# Patient Record
Sex: Male | Born: 1980 | Race: Black or African American | Hispanic: No | Marital: Single | Smoking: Never smoker
Health system: Southern US, Community
[De-identification: ages and names within clinical notes are randomized; demographics above are authoritative.]

## PROBLEM LIST (undated history)

## (undated) DIAGNOSIS — E7800 Pure hypercholesterolemia, unspecified: Secondary | ICD-10-CM

## (undated) DIAGNOSIS — Q150 Congenital glaucoma: Secondary | ICD-10-CM

## (undated) DIAGNOSIS — K219 Gastro-esophageal reflux disease without esophagitis: Secondary | ICD-10-CM

## (undated) DIAGNOSIS — F819 Developmental disorder of scholastic skills, unspecified: Secondary | ICD-10-CM

## (undated) DIAGNOSIS — N19 Unspecified kidney failure: Secondary | ICD-10-CM

## (undated) DIAGNOSIS — I10 Essential (primary) hypertension: Secondary | ICD-10-CM

## (undated) DIAGNOSIS — I509 Heart failure, unspecified: Secondary | ICD-10-CM

## (undated) DIAGNOSIS — H5440 Blindness, one eye, unspecified eye: Secondary | ICD-10-CM

## (undated) DIAGNOSIS — E559 Vitamin D deficiency, unspecified: Secondary | ICD-10-CM

## (undated) HISTORY — DX: Gastro-esophageal reflux disease without esophagitis: K21.9

## (undated) HISTORY — DX: Pure hypercholesterolemia, unspecified: E78.00

## (undated) HISTORY — DX: Developmental disorder of scholastic skills, unspecified: F81.9

## (undated) HISTORY — DX: Heart failure, unspecified: I50.9

## (undated) HISTORY — DX: Essential (primary) hypertension: I10

## (undated) HISTORY — DX: Congenital glaucoma: Q15.0

## (undated) HISTORY — DX: Vitamin D deficiency, unspecified: E55.9

## (undated) HISTORY — DX: Unspecified kidney failure: N19

## (undated) HISTORY — DX: Blindness, one eye, unspecified eye: H54.40

---

## 2003-09-17 ENCOUNTER — Encounter: Admission: RE | Admit: 2003-09-17 | Discharge: 2003-09-17 | Payer: Self-pay | Admitting: Family Medicine

## 2003-11-29 ENCOUNTER — Encounter: Admission: RE | Admit: 2003-11-29 | Discharge: 2003-11-29 | Payer: Self-pay | Admitting: Cardiothoracic Surgery

## 2007-07-13 HISTORY — PX: MULTIPLE TOOTH EXTRACTIONS: SHX2053

## 2017-05-31 ENCOUNTER — Encounter: Payer: Self-pay | Admitting: Family Medicine

## 2017-09-12 ENCOUNTER — Encounter: Payer: Self-pay | Admitting: Family Medicine

## 2018-06-06 ENCOUNTER — Encounter: Payer: Self-pay | Admitting: Family Medicine

## 2018-06-06 ENCOUNTER — Ambulatory Visit (INDEPENDENT_AMBULATORY_CARE_PROVIDER_SITE_OTHER): Payer: Self-pay | Admitting: Family Medicine

## 2018-06-06 ENCOUNTER — Encounter

## 2018-06-06 VITALS — BP 101/71 | HR 80 | Resp 17 | Ht 69.0 in | Wt 189.0 lb

## 2018-06-06 DIAGNOSIS — Z7689 Persons encountering health services in other specified circumstances: Secondary | ICD-10-CM

## 2018-06-06 DIAGNOSIS — Q249 Congenital malformation of heart, unspecified: Secondary | ICD-10-CM

## 2018-06-06 DIAGNOSIS — H409 Unspecified glaucoma: Secondary | ICD-10-CM

## 2018-06-06 DIAGNOSIS — F09 Unspecified mental disorder due to known physiological condition: Secondary | ICD-10-CM

## 2018-06-06 DIAGNOSIS — R1319 Other dysphagia: Secondary | ICD-10-CM

## 2018-06-06 DIAGNOSIS — R41841 Cognitive communication deficit: Secondary | ICD-10-CM

## 2018-06-06 DIAGNOSIS — F0789 Other personality and behavioral disorders due to known physiological condition: Secondary | ICD-10-CM

## 2018-06-06 MED ORDER — ASPIRIN EC 81 MG PO TBEC: 81.0000 mg | DELAYED_RELEASE_TABLET | Freq: Every day | ORAL | 1 refills | Status: DC

## 2018-06-06 MED ORDER — ATORVASTATIN CALCIUM 20 MG PO TABS
20.0000 mg | ORAL_TABLET | Freq: Every day | ORAL | 1 refills | Status: DC
Start: 2018-06-06 — End: 2018-09-06

## 2018-06-06 NOTE — Progress Notes (Signed)
Alan Owens, is a 37 y.o. male  ZOX:[id number]  [id number]:[id number]  DOB - 08-04-1980  CC:  Chief Complaint  Patient presents with  . Establish Care    would like to have it setup so he can have a personal care aide       HPI: Alan Owens is a 37 y.o. male is here today to establish care. He is accompanied by his mother who is his caregiver.  Unfortunately his mother is a poor historian. Patient has visible cognitive deficits and at present she has requested medical records which have not arrived prior to today's visit.  She reports that he has a in-home care attendant assist with ADLs and feedings. Patient is nonverbal.  Patient is also incontinent.  Mother is unable to identify underlying cause of patient's deficits.  He reported he was born normal however then she notes that he was not immediately discharged from the hospital due to illness, which she thinks was related to breathing and possibly his heart. She is unable to tell me what type of illness patient sustained after childbirth.  According to patient mother, patient has never been verbal.  Has in the past been followed by a cardiologist, she is uncertain of her neurologist, and by an ophthalmologist due to chronic worsening glaucoma.  He has not seen the eye doctor in some time, however she reports that he is partially blind.  She reports at some point he was followed by [facility].  She recently relocated to [place] from [place] patient's [place] was not transferred over as of yet.  He is currently prescribed blood pressure medication, takes a daily aspirin, Lipitor, and medication for management of reflux and GERD.  Mother is uncertain as to why he is on some any blood pressure medications  Current medications: Current Outpatient Medications:  .  amLODipine (NORVASC) 2.5 MG tablet, Take 2.5 mg by mouth 2 (two) times daily., Disp: , Rfl:  .  aspirin EC 81 MG tablet, Take 81 mg by mouth daily., Disp: , Rfl:  .  atorvastatin  (LIPITOR) 20 MG tablet, Take 20 mg by mouth daily., Disp: , Rfl:  .  brimonidine (ALPHAGAN) 0.15 % ophthalmic solution, Place 1 drop into both eyes 2 (two) times daily., Disp: , Rfl:  .  carvedilol (COREG) 6.25 MG tablet, Take 6.25 mg by mouth 2 (two) times daily with a meal., Disp: , Rfl:  .  famotidine (PEPCID) 20 MG tablet, Take 20 mg by mouth 2 (two) times daily., Disp: , Rfl:  .  furosemide (LASIX) 20 MG tablet, Take 20 mg by mouth daily., Disp: , Rfl:  .  latanoprost (XALATAN) 0.005 % ophthalmic solution, Place 1 drop into both eyes at bedtime., Disp: , Rfl:  .  lisinopril (PRINIVIL,ZESTRIL) 5 MG tablet, Take 5 mg by mouth 2 (two) times daily., Disp: , Rfl:  .  omeprazole (PRILOSEC) 20 MG capsule, Take 20 mg by mouth daily., Disp: , Rfl:  .  potassium chloride SA (K-DUR,KLOR-CON) 20 MEQ tablet, Take 20 mEq by mouth 2 (two) times daily., Disp: , Rfl:  .  terbinafine (LAMISIL) 250 MG tablet, Take 250 mg by mouth daily., Disp: , Rfl:    Pertinent family medical history: family history is not on file.   No Known Allergies  Social History   Socioeconomic History  . Marital status: Single    Spouse name: Not on file  . Number of children: Not on file  . Years of education: Not on file  .  Highest education level: Not on file  Occupational History  . Not on file  Social Needs  . Financial resource strain: Not on file  . Food insecurity:    Worry: Not on file    Inability: Not on file  . Transportation needs:    Medical: Not on file    Non-medical: Not on file  Tobacco Use  . Smoking status: Never Smoker  . Smokeless tobacco: Never Used  Substance and Sexual Activity  . Alcohol use: Never    Frequency: Never  . Drug use: Never  . Sexual activity: Never  Lifestyle  . Physical activity:    Days per week: Not on file    Minutes per session: Not on file  . Stress: Not on file  Relationships  . Social connections:    Talks on phone: Not on file    Gets together: Not on file     Attends religious service: Not on file    Active member of club or organization: Not on file    Attends meetings of clubs or organizations: Not on file    Relationship status: Not on file  . Intimate partner violence:    Fear of current or ex partner: Not on file    Emotionally abused: Not on file    Physically abused: Not on file    Forced sexual activity: Not on file  Other Topics Concern  . Not on file  Social History Narrative  . Not on file    Review of Systems: Unable to assess given patient cognitive deficits  Objective:   Vitals:   06/06/18 1510  BP: 101/71  Pulse: 80  Resp: 17  SpO2: 99%    BP Readings from Last 3 Encounters:  06/06/18 101/71    Filed Weights   06/06/18 1510  Weight: 189 lb (85.7 kg)      Physical Exam: Constitutional: Patient appears well-developed and well-nourished. No distress. HENT: Normocephalic, atraumatic, External right and left ear normal.  Tongue is slightly large for oral cavity. Eyes: PERRLA, no scleral icterus. Neck: Normal ROM. Neck supple. No JVD. No tracheal deviation. No thyromegaly. CVS: RRR, S1/S2 +, no murmurs, no gallops, no carotid bruit.  Pulmonary: Effort and breath sounds normal, no stridor, rhonchi, wheezes, rales.  Abdominal: Soft. BS +, no distension, tenderness, rebound or guarding.  Musculoskeletal: Normal range of motion. No edema and no tenderness.  Neuro: Alert. Normal muscle tone coordination.  Broad gaitt.  Skin: Skin is warm and dry. No rash noted. Not diaphoretic. No erythema. No pallor. Psychiatric: Normal mood and affect. Behavior, judgment, thought content normal.    Assessment and plan:  Encounter to establish care Glaucoma of both eyes, unspecified glaucoma type - Plan: Ambulatory referral to Ophthalmology Congenital heart disease - Plan: Ambulatory referral to Cardiology Other dysphagia - Plan: Ambulatory referral to Gastroenterology  Referrals have been placed based on limited divided by  mother and caregiver during today's visit.  I advised mother of the importance of following up to obtain all of patient's medical records in order to effectively evaluate and treat any underlying medical conditions. Patient's mother/caregiver will schedule a follow-up once patient's Medicare/Medicaid is in effect.    The patient was given clear instructions to go to ER or return to medical center if symptoms don't improve, worsen or new problems develop. The patient verbalized understanding. The patient was advised  to call and obtain lab results if they haven't heard anything from out office within 7-10 business days.  Molli Barrows, FNP Primary Care at [facility] [address] [place] [address] [phone]: [phone]    This note has been created with Dragon speech recognition software and Engineer, materials. Any transcriptional errors are unintentional.

## 2018-06-06 NOTE — Patient Instructions (Signed)
Thank you for choosing Primary Care at [facility] to be your medical home!    Alan Owens was seen by Joaquin Courts, FNP today.   Alan Owens's primary care provider is Bing Neighbors, FNP.   For the best care possible, you should try to see Joaquin Courts, FNP-C whenever you come to the clinic.   We look forward to seeing you again soon!  If you have any questions about your visit today, please call us at [phone] or feel free to reach your primary care provider via MyChart.

## 2018-06-20 ENCOUNTER — Encounter: Payer: Self-pay | Admitting: Gastroenterology

## 2018-06-21 ENCOUNTER — Telehealth: Payer: Self-pay | Admitting: Family Medicine

## 2018-06-21 NOTE — Telephone Encounter (Signed)
Patient called to say that he now has insurance (medicaid has now been added to chart) and would like a referral previously mentioned, please follow up.

## 2018-06-26 NOTE — Telephone Encounter (Signed)
Form for a personal care attendant has been completed and faxed with current medicaid number to [employer] on 06/23/2018

## 2018-07-03 ENCOUNTER — Telehealth: Payer: Self-pay | Admitting: Family Medicine

## 2018-07-03 NOTE — Telephone Encounter (Signed)
Refaxed PCS form to [place].

## 2018-07-03 NOTE — Telephone Encounter (Signed)
Patient called stating that she had reached out to [employer] to see about their referral, the people at [employer] stated that they have only received the top page of the referral, please follow up.

## 2018-07-11 ENCOUNTER — Telehealth: Payer: Self-pay | Admitting: Family Medicine

## 2018-07-11 ENCOUNTER — Other Ambulatory Visit: Payer: Self-pay

## 2018-07-11 DIAGNOSIS — N19 Unspecified kidney failure: Secondary | ICD-10-CM

## 2018-07-11 DIAGNOSIS — I77810 Thoracic aortic ectasia: Secondary | ICD-10-CM

## 2018-07-11 DIAGNOSIS — I509 Heart failure, unspecified: Secondary | ICD-10-CM

## 2018-07-11 DIAGNOSIS — E782 Mixed hyperlipidemia: Secondary | ICD-10-CM

## 2018-07-11 NOTE — Telephone Encounter (Signed)
Guardian called stating that patient has a cold and is unsure of what to give him, please follow up.

## 2018-07-11 NOTE — Telephone Encounter (Signed)
Please contact patient's caregiver to advise he will need to come into office for lab draw in order for me to refill his medications. He has not had any recent blood work after review of medical records received. He can come in Thursday or Friday. I would prefer that he is fasting so that lipid can be checked.

## 2018-07-13 ENCOUNTER — Encounter: Payer: Self-pay | Admitting: Family Medicine

## 2018-07-13 ENCOUNTER — Ambulatory Visit (INDEPENDENT_AMBULATORY_CARE_PROVIDER_SITE_OTHER): Payer: Medicaid Other | Admitting: Family Medicine

## 2018-07-13 ENCOUNTER — Other Ambulatory Visit: Payer: Medicaid Other

## 2018-07-13 VITALS — BP 123/80 | HR 86 | Temp 98.8°F | Resp 17 | Ht 69.0 in | Wt 182.8 lb

## 2018-07-13 DIAGNOSIS — R05 Cough: Secondary | ICD-10-CM

## 2018-07-13 DIAGNOSIS — I509 Heart failure, unspecified: Secondary | ICD-10-CM

## 2018-07-13 DIAGNOSIS — J019 Acute sinusitis, unspecified: Secondary | ICD-10-CM | POA: Diagnosis not present

## 2018-07-13 DIAGNOSIS — R0981 Nasal congestion: Secondary | ICD-10-CM

## 2018-07-13 DIAGNOSIS — E782 Mixed hyperlipidemia: Secondary | ICD-10-CM

## 2018-07-13 DIAGNOSIS — N19 Unspecified kidney failure: Secondary | ICD-10-CM

## 2018-07-13 DIAGNOSIS — R0989 Other specified symptoms and signs involving the circulatory and respiratory systems: Secondary | ICD-10-CM

## 2018-07-13 DIAGNOSIS — R059 Cough, unspecified: Secondary | ICD-10-CM

## 2018-07-13 MED ORDER — POTASSIUM CHLORIDE CRYS ER 20 MEQ PO TBCR: 20.0000 meq | EXTENDED_RELEASE_TABLET | Freq: Every day | ORAL | 0 refills | Status: DC

## 2018-07-13 MED ORDER — BENZONATATE 100 MG PO CAPS: 100.0000 mg | ORAL_CAPSULE | Freq: Three times a day (TID) | ORAL | 0 refills | Status: DC | PRN

## 2018-07-13 MED ORDER — AZITHROMYCIN 250 MG PO TABS: ORAL_TABLET | ORAL | 0 refills | Status: DC

## 2018-07-13 MED ORDER — FUROSEMIDE 20 MG PO TABS: 20.0000 mg | ORAL_TABLET | Freq: Every day | ORAL | 0 refills | Status: DC

## 2018-07-13 MED ORDER — FAMOTIDINE 20 MG PO TABS: 20.0000 mg | ORAL_TABLET | Freq: Two times a day (BID) | ORAL | 0 refills | Status: DC

## 2018-07-13 MED ORDER — IPRATROPIUM BROMIDE 0.03 % NA SOLN: 2.0000 | Freq: Two times a day (BID) | NASAL | 0 refills | Status: DC

## 2018-07-13 MED ORDER — AMLODIPINE BESYLATE 2.5 MG PO TABS: 2.5000 mg | ORAL_TABLET | Freq: Every day | ORAL | 0 refills | Status: DC

## 2018-07-13 MED ORDER — LISINOPRIL 10 MG PO TABS: 10.0000 mg | ORAL_TABLET | Freq: Every day | ORAL | 0 refills | Status: DC

## 2018-07-13 MED ORDER — OMEPRAZOLE 20 MG PO CPDR: 20.0000 mg | DELAYED_RELEASE_CAPSULE | Freq: Every day | ORAL | 0 refills | Status: DC

## 2018-07-13 MED ORDER — GUAIFENESIN ER 600 MG PO TB12: 1200.0000 mg | ORAL_TABLET | Freq: Two times a day (BID) | ORAL | 0 refills | Status: AC

## 2018-07-13 MED ORDER — CARVEDILOL 6.25 MG PO TABS: 6.2500 mg | ORAL_TABLET | Freq: Two times a day (BID) | ORAL | 0 refills | Status: DC

## 2018-07-13 NOTE — Progress Notes (Signed)
Got in contact with Alan Owens mother, she stated that she would give [place] a call when she knew when she would be able to bring him. Thanks.

## 2018-07-13 NOTE — Progress Notes (Signed)
Acute Office Visit  Subjective:    Patient ID: Alan Owens, male    DOB: 1981-02-23, 38 y.o.   MRN: [id number]  Chief Complaint  Patient presents with  . URI    dry cough, nasal congestion & fatigue x 4 days. hasn't taken anything OTC   HPI Patient is in today with a complaint of 4 days of dry cough, congestion and mother reports fatigue x 4 days. Patient is non-verbal and unable to provide history therefore HPI is obtained from mother. Reports fever with TMAX 100 F.last night and night sweats. No known sick contacts. Poor food intake, although is hydrating well. Cough occasionally produces green mucus plugs. No known history of asthma, bronchitis, or environmental allergies. Mother denies noticing any changes in respiratory effort or ability to breath. Past Medical History:  Diagnosis Date  . Acid reflux   . Blind left eye   . Congestive heart failure (HCC)   . Hypercholesterolemia   . Hypertension   . Juvenile glaucoma   . Mental developmental delay   . Renal failure   . Vitamin D deficiency      Family History  Problem Relation Age of Onset  . Diabetes Sibling   . Hypertension Sibling     Social History   Socioeconomic History  . Marital status: Single    Spouse name: Not on file  . Number of children: Not on file  . Years of education: Not on file  . Highest education level: Not on file  Occupational History  . Not on file  Social Needs  . Financial resource strain: Not on file  . Food insecurity:    Worry: Not on file    Inability: Not on file  . Transportation needs:    Medical: Not on file    Non-medical: Not on file  Tobacco Use  . Smoking status: Never Smoker  . Smokeless tobacco: Never Used  Substance and Sexual Activity  . Alcohol use: Never    Frequency: Never  . Drug use: Never  . Sexual activity: Never  Lifestyle  . Physical activity:    Days per week: Not on file    Minutes per session: Not on file  . Stress: Not on file  Relationships   . Social connections:    Talks on phone: Not on file    Gets together: Not on file    Attends religious service: Not on file    Active member of club or organization: Not on file    Attends meetings of clubs or organizations: Not on file    Relationship status: Not on file  . Intimate partner violence:    Fear of current or ex partner: Not on file    Emotionally abused: Not on file    Physically abused: Not on file    Forced sexual activity: Not on file  Other Topics Concern  . Not on file  Social History Narrative  . Not on file    Outpatient Medications Prior to Visit  Medication Sig Dispense Refill  . amLODipine (NORVASC) 2.5 MG tablet Take 1 tablet (2.5 mg total) by mouth daily. 30 tablet 0  . aspirin EC 81 MG tablet Take 1 tablet (81 mg total) by mouth daily. 90 tablet 1  . atorvastatin (LIPITOR) 20 MG tablet Take 1 tablet (20 mg total) by mouth daily at 6 PM. 30 tablet 1  . brimonidine (ALPHAGAN) 0.15 % ophthalmic solution Place 1 drop into both eyes 2 (two) times daily.    Marland Kitchen  carvedilol (COREG) 6.25 MG tablet Take 1 tablet (6.25 mg total) by mouth 2 (two) times daily with a meal. 60 tablet 0  . famotidine (PEPCID) 20 MG tablet Take 1 tablet (20 mg total) by mouth 2 (two) times daily. 30 tablet 0  . furosemide (LASIX) 20 MG tablet Take 1 tablet (20 mg total) by mouth daily. 30 tablet 0  . latanoprost (XALATAN) 0.005 % ophthalmic solution Place 1 drop into both eyes at bedtime.    Marland Kitchen lisinopril (PRINIVIL,ZESTRIL) 10 MG tablet Take 1 tablet (10 mg total) by mouth daily. 30 tablet 0  . omeprazole (PRILOSEC) 20 MG capsule Take 1 capsule (20 mg total) by mouth daily. 30 capsule 0  . potassium chloride SA (K-DUR,KLOR-CON) 20 MEQ tablet Take 1 tablet (20 mEq total) by mouth daily. 30 tablet 0  . terbinafine (LAMISIL) 250 MG tablet Take 250 mg by mouth daily.     No facility-administered medications prior to visit.     No Known Allergies  ROS Pertinent negatives listed in HPI     Objective:     BP 123/80   Pulse 86   Temp 98.8 F (37.1 C) (Oral)   Resp 17   Ht 5\' 9"  (1.753 m)   Wt 182 lb 12.8 oz (82.9 kg)   SpO2 97%   BMI 26.99 kg/m   Wt Readings from Last 3 Encounters:  07/13/18 182 lb 12.8 oz (82.9 kg)  06/06/18 189 lb (85.7 kg)   Physical Exam  Constitutional: He appears well-nourished. No distress.  HENT:  Head: Normocephalic and atraumatic.  Nose: Mucosal edema and rhinorrhea present.  Mouth/Throat: Posterior oropharyngeal erythema present. No posterior oropharyngeal edema.  Cardiovascular: Normal rate, regular rhythm, normal heart sounds and intact distal pulses.  Pulmonary/Chest: Effort normal and breath sounds normal.  Lymphadenopathy:    He has cervical adenopathy.  Neurological: He is alert.  Skin: Skin is warm. He is not diaphoretic. No erythema.  Psychiatric: He has a normal mood and affect. His behavior is normal. Judgment and thought content normal.    Health Maintenance Due  Topic Date Due  . HIV Screening  11/24/1993  . TETANUS/TDAP  11/24/1997    Assessment & Plan:  1. Acute sinusitis, recurrence not specified, unspecified location -Azithromycin dose pack -Atrovent nasal spray for congestion BID PRN  -Mucinex 1200 mg BID x 14 days to thin secretions   2. Cough -Benzonatate 100 mg-200 mg 3 times daily as needed   3. Chest congestion -Atrovent nasal spray for congestion BID PRN  -Mucinex 1200 mg BID x 14 days to thin secretions   Meds ordered this encounter  Medications  . benzonatate (TESSALON) 100 MG capsule    Sig: Take 1-2 capsules (100-200 mg total) by mouth 3 (three) times daily as needed for cough.    Dispense:  60 capsule    Refill:  0  . guaiFENesin (MUCINEX) 600 MG 12 hr tablet    Sig: Take 2 tablets (1,200 mg total) by mouth 2 (two) times daily for 14 days.    Dispense:  56 tablet    Refill:  0  . ipratropium (ATROVENT) 0.03 % nasal spray    Sig: Place 2 sprays into both nostrils 2 (two) times daily.     Dispense:  30 mL    Refill:  0  . azithromycin (ZITHROMAX) 250 MG tablet    Sig: Take 2 tabs PO x 1 dose, then 1 tab PO QD x 4 days    Dispense:  6 tablet    Refill:  0    Return for care as needed.   Joaquin Courts, FNP-C Primary Care at [facility] [address], [place] [address] [phone]: [phone]

## 2018-07-13 NOTE — Telephone Encounter (Signed)
Patient will need lab appointment prior to additional refills

## 2018-07-13 NOTE — Patient Instructions (Signed)

## 2018-07-15 LAB — LIPID PANEL
CHOLESTEROL TOTAL: 109 mg/dL (ref 100–199)
Chol/HDL Ratio: 2.8 ratio (ref 0.0–5.0)
HDL: 39 mg/dL — ABNORMAL LOW (ref >39)
LDL CALC: 53 mg/dL (ref 0–99)
TRIGLYCERIDES: 83 mg/dL (ref 0–149)
VLDL CHOLESTEROL CAL: 17 mg/dL (ref 5–40)

## 2018-07-15 LAB — CBC WITH DIFFERENTIAL/PLATELET
Basophils Absolute: 0 10*3/uL (ref 0.0–0.2)
Basos: 1 %
EOS (ABSOLUTE): 0.1 10*3/uL (ref 0.0–0.4)
Eos: 1 %
Hematocrit: 31.8 % — ABNORMAL LOW (ref 37.5–51.0)
Hemoglobin: 10.7 g/dL — ABNORMAL LOW (ref 13.0–17.7)
IMMATURE GRANULOCYTES: 0 %
Immature Grans (Abs): 0 10*3/uL (ref 0.0–0.1)
Lymphocytes Absolute: 1.8 10*3/uL (ref 0.7–3.1)
Lymphs: 25 %
MCH: 31.1 pg (ref 26.6–33.0)
MCHC: 33.6 g/dL (ref 31.5–35.7)
MCV: 92 fL (ref 79–97)
MONOS ABS: 0.4 10*3/uL (ref 0.1–0.9)
Monocytes: 6 %
NEUTROS PCT: 67 %
Neutrophils Absolute: 4.8 10*3/uL (ref 1.4–7.0)
PLATELETS: 311 10*3/uL (ref 150–450)
RBC: 3.44 x10E6/uL — ABNORMAL LOW (ref 4.14–5.80)
RDW: 13.5 % (ref 12.3–15.4)
WBC: 7.1 10*3/uL (ref 3.4–10.8)

## 2018-07-15 LAB — COMPREHENSIVE METABOLIC PANEL
ALK PHOS: 117 IU/L (ref 39–117)
ALT: 19 IU/L (ref 0–44)
AST: 20 IU/L (ref 0–40)
Albumin/Globulin Ratio: 1.6 (ref 1.2–2.2)
Albumin: 4.6 g/dL (ref 3.5–5.5)
BUN/Creatinine Ratio: 7 — ABNORMAL LOW (ref 9–20)
BUN: 9 mg/dL (ref 6–20)
Bilirubin Total: 0.6 mg/dL (ref 0.0–1.2)
CALCIUM: 9.7 mg/dL (ref 8.7–10.2)
CO2: 21 mmol/L (ref 20–29)
Chloride: 105 mmol/L (ref 96–106)
Creatinine, Ser: 1.32 mg/dL — ABNORMAL HIGH (ref 0.76–1.27)
GFR calc non Af Amer: 67 mL/min/{1.73_m2} (ref >59)
GFR, EST AFRICAN AMERICAN: 78 mL/min/{1.73_m2} (ref >59)
GLOBULIN, TOTAL: 2.9 g/dL (ref 1.5–4.5)
Glucose: 92 mg/dL (ref 65–99)
POTASSIUM: 3.7 mmol/L (ref 3.5–5.2)
Sodium: 143 mmol/L (ref 134–144)
Total Protein: 7.5 g/dL (ref 6.0–8.5)

## 2018-07-15 LAB — HEMOGLOBIN A1C
ESTIMATED AVERAGE GLUCOSE: 117 mg/dL
Hgb A1c MFr Bld: 5.7 % — ABNORMAL HIGH (ref 4.8–5.6)

## 2018-07-15 LAB — THYROID PANEL WITH TSH
Free Thyroxine Index: 2.4 (ref 1.2–4.9)
T3 Uptake Ratio: 23 % — ABNORMAL LOW (ref 24–39)
T4, Total: 10.6 ug/dL (ref 4.5–12.0)
TSH: 1.53 u[IU]/mL (ref 0.450–4.500)

## 2018-07-18 ENCOUNTER — Ambulatory Visit: Payer: Self-pay | Admitting: Gastroenterology

## 2018-07-19 ENCOUNTER — Telehealth: Payer: Self-pay | Admitting: Family Medicine

## 2018-07-19 NOTE — Telephone Encounter (Signed)
Notify patient's mother as to the following regarding labs Hemoglobin slightly low recommend patient start taking a daily iron supplement which is likely the underlying cause for low iron globin level. He has prediabetes although not at a level in which he would require any form of treatment at this point.  Encourage increasing intake of vegetables and fruits. Renal function is slightly elevated this is likely due to being prescribed a chronic diuretic which is Lasix.  Ensure that he is hydrating well and receiving enough water as dehydration can adversely cause this value to go up.

## 2018-07-19 NOTE — Telephone Encounter (Signed)
Patient's mom notified of lab results & recommendations. Expressed understanding.

## 2018-07-24 ENCOUNTER — Telehealth: Payer: Self-pay | Admitting: Family Medicine

## 2018-07-24 NOTE — Telephone Encounter (Signed)
Patient called requesting a referral to [facility], patient states that she is having too many problems with [facility] and setting up visits with them and would just like a different home care. Please follow up.

## 2018-07-24 NOTE — Telephone Encounter (Signed)
Montel Clock,  Please contact [facility] to request a referral form to request a personal attendant for patient. Since we've already completed a Personal Care attendant form inquire if they'll except the same form or will I need to complete a new one from their company.

## 2018-07-24 NOTE — Telephone Encounter (Signed)
Will call tomorrow. Office closes at 2:30 pm  [facility] Address: [address] [place], [place], [place] [address] Phone: [phone]

## 2018-07-25 NOTE — Progress Notes (Signed)
Cardiology Office Note   Date:  07/27/2018   ID:  Alan Owens, DOB April 09, 1981, MRN [id number]  PCP:  Bing NeighborsHarris, Kimberly S, FNP  Cardiologist:   Charlton HawsPeter Kerim Statzer, MD   No chief complaint on file.     History of Present Illness: Alan Owens is a 38 y.o. male who presents for consultation regarding congenital heart disease. Unfortunately patient is partially blind and non verbal since nearly birth. He needs aids to help with feedings. 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 No signs of CHF She lays out his meds. He needs to see GI for ? Aspiration     Past Medical History:  Diagnosis Date  . Acid reflux   . Blind left eye   . Congestive heart failure (HCC)   . Hypercholesterolemia   . Hypertension   . Juvenile glaucoma   . Mental developmental delay   . Renal failure   . Vitamin D deficiency     History reviewed. No pertinent surgical history.   Current Outpatient Medications  Medication Sig Dispense Refill  . amLODipine (NORVASC) 2.5 MG tablet Take 1 tablet (2.5 mg total) by mouth daily. 30 tablet 0  . aspirin EC 81 MG tablet Take 1 tablet (81 mg total) by mouth daily. 90 tablet 1  . atorvastatin (LIPITOR) 20 MG tablet Take 1 tablet (20 mg total) by mouth daily at 6 PM. 30 tablet 1  . benzonatate (TESSALON) 100 MG capsule Take 1-2 capsules (100-200 mg total) by mouth 3 (three) times daily as needed for cough. 60 capsule 0  . brimonidine (ALPHAGAN) 0.15 % ophthalmic solution Place 1 drop into both eyes 2 (two) times daily.    . carvedilol (COREG) 6.25 MG tablet Take 1 tablet (6.25 mg total) by  mouth 2 (two) times daily with a meal. 60 tablet 0  . famotidine (PEPCID) 20 MG tablet Take 1 tablet (20 mg total) by mouth 2 (two) times daily. 30 tablet 0  . furosemide (LASIX) 20 MG tablet Take 1 tablet (20 mg total) by mouth daily. 30 tablet 0  . ipratropium (ATROVENT) 0.03 % nasal spray Place 2 sprays into both nostrils 2 (two) times daily. 30 mL 0  . latanoprost (XALATAN) 0.005 % ophthalmic solution Place 1 drop into both eyes at bedtime.    Marland Kitchen. lisinopril (PRINIVIL,ZESTRIL) 10 MG tablet Take 1 tablet (10 mg total) by mouth daily. 30 tablet 0  . potassium chloride SA (K-DUR,KLOR-CON) 20 MEQ tablet Take 1 tablet (20 mEq total) by mouth daily. 30 tablet 0  . azithromycin (ZITHROMAX) 250 MG tablet Take 2 tabs PO x 1 dose, then 1 tab PO QD x 4 days (Patient not taking: Reported on 07/27/2018) 6 tablet 0  . guaiFENesin (MUCINEX) 600 MG 12 hr tablet Take 2 tablets (1,200 mg total) by mouth 2 (two) times daily for 14 days. (Patient not taking: Reported on 07/27/2018) 56 tablet 0  . omeprazole (PRILOSEC) 20 MG capsule Take 1 capsule (20 mg total) by mouth daily. (Patient not taking: Reported on 07/27/2018) 30 capsule 0  .  terbinafine (LAMISIL) 250 MG tablet Take 250 mg by mouth daily.     No current facility-administered medications for this visit.     Allergies:   Patient has no known allergies.    Social History:  The patient  reports that he has never smoked. He has never used smokeless tobacco. He reports that he does not drink alcohol or use drugs.   Family History:  The patient's family history includes Diabetes in his sibling; Hypertension in his sibling.    ROS:  Please see the history of present illness.   Otherwise, review of systems are positive for none.   All other systems are reviewed and negative.    PHYSICAL EXAM: VS:  BP 118/76   Pulse 74   Ht 5\' 7"  (1.702 m)   Wt 184 lb (83.5 kg)   SpO2 99%   BMI 28.82 kg/m  , BMI Body mass index is 28.82 kg/m. Not communicative    Overweight black male  HEENT: normal Neck supple with no adenopathy JVP normal no bruits no thyromegaly Lungs clear with no wheezing and good diaphragmatic motion Heart:  S1/S2 no murmur, no rub, gallop or click PMI normal Abdomen: benighn, BS positve, no tenderness, no AAA no bruit.  No HSM or HJR Distal pulses intact with no bruits No edema Neuro non-focal Skin warm and dry No muscular weakness    EKG:   07/27/18 SR rate 74 normal    Recent Labs: 07/13/2018: ALT 19; BUN 9; Creatinine, Ser 1.32; Hemoglobin 10.7; Platelets 311; Potassium 3.7; Sodium 143; TSH 1.530    Lipid Panel    Component Value Date/Time   CHOL 109 07/13/2018 1335   TRIG 83 07/13/2018 1335   HDL 39 (L) 07/13/2018 1335   CHOLHDL 2.8 07/13/2018 1335   LDLCALC 53 07/13/2018 1335      Wt Readings from Last 3 Encounters:  07/27/18 184 lb (83.5 kg)  07/13/18 182 lb 12.8 oz (82.9 kg)  06/06/18 189 lb (85.7 kg)      Other studies Reviewed: Additional studies/ records that were reviewed today include: Notes from primary and labs .    ASSESSMENT AND PLAN:  1.  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 HTN:  Well controlled.  Continue current medications and low sodium Dash type diet.   3. HLDL continue statin    Current medicines are reviewed at length with the patient today.  The patient does not have concerns regarding medicines.  The following changes have been made:  no change  Labs/ tests ordered today include: TTE  Orders Placed This Encounter  Procedures  . EKG 12-Lead  . ECHOCARDIOGRAM COMPLETE     Disposition:   FU with cardiology in a year     Signed, Charlton Haws, MD  07/27/2018 11:15 AM    [facility] [address] [place], [place], [place]  [address] Phone: [phone]; Fax: [phone]

## 2018-07-26 NOTE — Telephone Encounter (Signed)
They will require a new [id number] form that doesn't have [employer] on it, last office note & an up to date med list.

## 2018-07-26 NOTE — Telephone Encounter (Signed)
Faxed PCS form, last office note & med list to [phone]

## 2018-07-27 ENCOUNTER — Telehealth: Payer: Self-pay | Admitting: Family Medicine

## 2018-07-27 ENCOUNTER — Ambulatory Visit: Payer: Medicaid Other | Admitting: Cardiovascular Disease

## 2018-07-27 ENCOUNTER — Encounter: Payer: Self-pay | Admitting: Cardiovascular Disease

## 2018-07-27 VITALS — BP 118/76 | HR 74 | Ht 67.0 in | Wt 184.0 lb

## 2018-07-27 DIAGNOSIS — I429 Cardiomyopathy, unspecified: Secondary | ICD-10-CM

## 2018-07-27 NOTE — Telephone Encounter (Signed)
Left voice mail to call back 

## 2018-07-27 NOTE — Patient Instructions (Signed)
Medication Instructions:   If you need a refill on your cardiac medications before your next appointment, please call your pharmacy.   Lab work:  If you have labs (blood work) drawn today and your tests are completely normal, you will receive your results only by: . MyChart Message (if you have MyChart) OR . A paper copy in the mail If you have any lab test that is abnormal or we need to change your treatment, we will call you to review the results.  Testing/Procedures: Your physician has requested that you have an echocardiogram. Echocardiography is a painless test that uses sound waves to create images of your heart. It provides your doctor with information about the size and shape of your heart and how well your heart's chambers and valves are working. This procedure takes approximately one hour. There are no restrictions for this procedure.  Follow-Up: At [facility], you and your health needs are our priority.  As part of our continuing mission to provide you with exceptional heart care, we have created designated Provider Care Teams.  These Care Teams include your primary Cardiologist (physician) and Advanced Practice Providers (APPs -  Physician Assistants and Nurse Practitioners) who all work together to provide you with the care you need, when you need it. You will need a follow up appointment in 1 years.  Please call our office 2 months in advance to schedule this appointment.  You may see Dr. Nishan or one of the following Advanced Practice Providers on your designated Care Team:   Lori Gerhardt, NP Laura Ingold, NP . Jill McDaniel, NP    

## 2018-07-27 NOTE — Telephone Encounter (Signed)
Alan Owens was calling regarding a form that was faxed over to her, states that phone number is illegible and that step 3 there is a sequence of statements that needed to be filled out and she states she has a question about one if the answers listed. Please follow up.

## 2018-08-01 ENCOUNTER — Telehealth: Payer: Self-pay | Admitting: Family Medicine

## 2018-08-01 NOTE — Telephone Encounter (Signed)
Called [facility] to follow up. They wanted clarification on the last question in section 3. The question clarifies whether or not patient can get at home care or if he needs to be in a facility. Clarified answer for office & they will call patient's mom to initiate care.

## 2018-08-01 NOTE — Telephone Encounter (Signed)
Patient called requesting a call back from nurse, patient stated she is having trouble with [facility], please follow up.

## 2018-08-04 ENCOUNTER — Other Ambulatory Visit: Payer: Self-pay | Admitting: Family Medicine

## 2018-08-04 ENCOUNTER — Other Ambulatory Visit (HOSPITAL_COMMUNITY): Payer: Medicaid Other

## 2018-08-09 ENCOUNTER — Ambulatory Visit (HOSPITAL_COMMUNITY): Payer: Medicaid Other | Attending: Cardiology

## 2018-08-09 DIAGNOSIS — I429 Cardiomyopathy, unspecified: Secondary | ICD-10-CM | POA: Diagnosis not present

## 2018-08-10 ENCOUNTER — Telehealth: Payer: Self-pay

## 2018-08-10 MED ORDER — POTASSIUM CHLORIDE CRYS ER 20 MEQ PO TBCR: 20.0000 meq | EXTENDED_RELEASE_TABLET | Freq: Every day | ORAL | 3 refills | Status: DC

## 2018-08-10 MED ORDER — LISINOPRIL 10 MG PO TABS: 10.0000 mg | ORAL_TABLET | Freq: Every day | ORAL | 3 refills | Status: DC

## 2018-08-10 MED ORDER — FUROSEMIDE 20 MG PO TABS: 20.0000 mg | ORAL_TABLET | Freq: Every day | ORAL | 3 refills | Status: DC

## 2018-08-10 MED ORDER — CARVEDILOL 6.25 MG PO TABS: 6.2500 mg | ORAL_TABLET | Freq: Two times a day (BID) | ORAL | 3 refills | Status: DC

## 2018-08-10 MED ORDER — AMLODIPINE BESYLATE 2.5 MG PO TABS: 2.5000 mg | ORAL_TABLET | Freq: Every day | ORAL | 3 refills | Status: DC

## 2018-08-10 NOTE — Progress Notes (Addendum)
Gastroenterology Consult Note:  History: Alan Owens 08/11/2018  Referring physician: Bing Neighbors, FNP  Reason for consult/chief complaint: Dysphagia (for years. 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  Somehow the history was obtained occultly swallowing and he was then referred to [place]. He was seen by Dr. Eden Emms of cardiology in the last couple of weeks for a history of unknown congenital heart condition.  The patient's mother reportedly had little information about this and there were few prior records.  Echocardiogram was performed 2 days ago.  [name]'s mother gives a history since he is nonverbal. She says that for many years he will sometimes seem to indicate that his stomach hurts.  He will point to it or maybe hold it, and so she will have him sit up in bed until he feels better.  He has had difficulty swallowing and often coughs while eating, which is been going on since childhood.  She does not know if it is ever been investigated, but does not believe he has had speech pathology evaluation or upper endoscopy. As near as can be determined, his appetite is good and he is not losing weight.  ROS:  Review of Systems Unable to obtain since he is nonverbal.  Past Medical History: Past Medical History:  Diagnosis Date  . Acid reflux   . Blind left eye   . Congestive heart failure (HCC)   . Hypercholesterolemia   . Hypertension   . Juvenile glaucoma   . Mental developmental delay   . Renal failure   . Vitamin D deficiency      Past Surgical History: History reviewed. No pertinent surgical history. No prior surgery  Family History: Family History  Problem  Relation Age of Onset  . Diabetes Sibling   . Hypertension Sibling     Social History: Social History   Socioeconomic History  . Marital status: Single    Spouse name: Not on file  . Number of children: Not on file  . Years of education: Not on file  . Highest education level: Not on file  Occupational History  . Not on file  Social Needs  . Financial resource strain: Not on file  . Food insecurity:    Worry: Not on file    Inability: Not on file  . Transportation needs:    Medical: Not on file    Non-medical: Not on file  Tobacco Use  . Smoking status: Never Smoker  . Smokeless tobacco: Never Used  Substance and Sexual Activity  . Alcohol use: Never    Frequency: Never  . Drug use: Never  . Sexual activity: Never  Lifestyle  . Physical activity:    Days per week: Not on file    Minutes per session: Not on file  . Stress: Not on file  Relationships  . Social connections:    Talks on phone: Not on file    Gets together: Not on file    Attends religious service: Not on file    Active member of club or organization: Not on file    Attends meetings of clubs or organizations: Not on file    Relationship status: Not on file  Other Topics Concern  . Not on  file  Social History Narrative  . Not on file    Allergies: No Known Allergies  Outpatient Meds: Current Outpatient Medications  Medication Sig Dispense Refill  . amLODipine (NORVASC) 2.5 MG tablet Take 1 tablet (2.5 mg total) by mouth daily. 90 tablet 3  . aspirin EC 81 MG tablet Take 1 tablet (81 mg total) by mouth daily. 90 tablet 1  . atorvastatin (LIPITOR) 20 MG tablet Take 1 tablet (20 mg total) by mouth daily at 6 PM. 30 tablet 1  . azithromycin (ZITHROMAX) 250 MG tablet Take 2 tabs PO x 1 dose, then 1 tab PO QD x 4 days 6 tablet 0  . brimonidine (ALPHAGAN) 0.15 % ophthalmic solution Place 1 drop into both eyes 2 (two) times daily.    . carvedilol (COREG) 6.25 MG tablet Take 1 tablet (6.25 mg total) by  mouth 2 (two) times daily with a meal. 180 tablet 3  . furosemide (LASIX) 20 MG tablet Take 1 tablet (20 mg total) by mouth daily. 90 tablet 3  . ipratropium (ATROVENT) 0.03 % nasal spray Place 2 sprays into both nostrils 2 (two) times daily. 30 mL 0  . latanoprost (XALATAN) 0.005 % ophthalmic solution Place 1 drop into both eyes at bedtime.    Marland Kitchen. lisinopril (PRINIVIL,ZESTRIL) 10 MG tablet Take 1 tablet (10 mg total) by mouth daily. 90 tablet 3  . omeprazole (PRILOSEC) 20 MG capsule Take 1 capsule (20 mg total) by mouth daily. 30 capsule 0  . potassium chloride SA (K-DUR,KLOR-CON) 20 MEQ tablet Take 1 tablet (20 mEq total) by mouth daily. 90 tablet 3   No current facility-administered medications for this visit.       ___________________________________________________________________ Objective   Exam:  BP 100/80   Pulse 68   Ht 5\' 9"  (1.753 m)   Wt 181 lb 6 oz (82.3 kg)   BMI 26.78 kg/m    General: Follows commands well, poor eye contact, nonverbal, nonspecific craniofacial deformity  Eyes: sclera anicteric, no redness  ENT: oral mucosa moist without lesions, no cervical or supraclavicular lymphadenopathy  CV: RRR without murmur, S1/S2, no JVD, no peripheral edema  Resp: clear to auscultation bilaterally, normal RR and effort noted  GI: soft, no apparent tenderness, with active bowel sounds. No guarding or palpable organomegaly noted.  Skin; warm and dry, no rash or jaundice noted  Neuro: awake, alert and oriented x 3. Normal gross motor function and fluent speech   Radiologic Studies:  08/09/18 Echo:  LVEF 45-50%, hypokinesis, o/w normal study  Assessment: Encounter Diagnoses  Name Primary?  . Pharyngoesophageal dysphagia Yes  . 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  He could be having aspiration, but his mother says he is on a pured diet.  She is uncertain the  extent of the problem and what dietary or swallowing maneuvers might be helpful to decrease his risk of aspiration.  Modified barium swallow ordered.  Abdominal pain cannot be characterized any further due to limits communicating with this patient.  Consider H. pylori gastritis, peptic ulcer, less likely malignancy given its duration.  Plan:  In addition to MBS, upper endoscopy scheduled.  His mother was agreeable after discussion of procedure and risks.  The benefits and risks of the planned procedure were described in detail with the patient or (when appropriate) their health care proxy.  Risks were outlined as including, but not limited to, bleeding, infection,  perforation, adverse medication reaction leading to cardiac or pulmonary decompensation, or pancreatitis (if ERCP).  The limitation of incomplete mucosal visualization was also discussed.  No guarantees or warranties were given.   Thank you for the courtesy of this consult.  Please call me with any questions or concerns.  Charlie Pitter III  CC: Referring provider noted above

## 2018-08-10 NOTE — Telephone Encounter (Signed)
Alan Owens (DPR) is aware of results. Per Dr. Eden Emms, EF a little better than previously reported continue current meds overall good. She requested refills on patient's medications. Will send refills for cardiac medications.

## 2018-08-10 NOTE — Telephone Encounter (Signed)
-----   Message from Wendall Stade, MD sent at 08/09/2018  4:47 PM EST ----- EF a little better than previously reported continue current meds overall good

## 2018-08-11 ENCOUNTER — Ambulatory Visit: Payer: Medicaid Other | Admitting: Gastroenterology

## 2018-08-11 ENCOUNTER — Other Ambulatory Visit (HOSPITAL_COMMUNITY): Payer: Self-pay

## 2018-08-11 ENCOUNTER — Encounter: Payer: Self-pay | Admitting: Gastroenterology

## 2018-08-11 VITALS — BP 100/80 | HR 68 | Ht 69.0 in | Wt 181.4 lb

## 2018-08-11 DIAGNOSIS — R1013 Epigastric pain: Secondary | ICD-10-CM

## 2018-08-11 DIAGNOSIS — R1314 Dysphagia, pharyngoesophageal phase: Secondary | ICD-10-CM | POA: Diagnosis not present

## 2018-08-11 DIAGNOSIS — R1310 Dysphagia, unspecified: Secondary | ICD-10-CM

## 2018-08-11 DIAGNOSIS — G8929 Other chronic pain: Secondary | ICD-10-CM | POA: Diagnosis not present

## 2018-08-11 NOTE — Patient Instructions (Signed)
If you are age 40 or older, your body mass index should be between 23-30. Your Body mass index is 26.78 kg/m. If this is out of the aforementioned range listed, please consider follow up with your Primary Care Provider.  If you are age 89 or younger, your body mass index should be between 19-25. Your Body mass index is 26.78 kg/m. If this is out of the aformentioned range listed, please consider follow up with your Primary Care Provider.   You have been scheduled for a modified barium swallow on 08-25-2018 at 11am. Please arrive 15 minutes prior to your test for registration. You will go to [facility] (1st Floor) for your appointment. Should you need to cancel or reschedule your appointment, please contact [phone] [facility] [place]) or [phone] [facility]). _____________________________________________________________________  A Modified Barium Swallow Study, or MBS, is a special x-ray that is taken to check swallowing skills. It is carried out by a Marine scientist and a Warehouse manager (SLP). During this test, yourmouth, throat, and esophagus, a muscular tube which connects your mouth to your stomach, is checked. The test will help you, your doctor, and the SLP plan what types of foods and liquids are easier for you to swallow. The SLP will also identify positions and ways to help you swallow more easily and safely. What will happen during an MBS? You will be taken to an x-ray room and seated comfortably. You will be asked to swallow small amounts of food and liquid mixed with barium. Barium is a liquid or paste that allows images of your mouth, throat and esophagus to be seen on x-ray. The x-ray captures moving images of the food you are swallowing as it travels from your mouth through your throat and into your esophagus. This test helps identify whether food or liquid is entering your lungs (aspiration). The test also shows which part of your mouth or throat lacks  strength or coordination to move the food or liquid in the right direction. This test typically takes 30 minutes to 1 hour to complete. _______________________________________________________________________

## 2018-08-14 ENCOUNTER — Encounter: Payer: Medicaid Other | Admitting: Family Medicine

## 2018-08-24 ENCOUNTER — Other Ambulatory Visit: Payer: Self-pay | Admitting: Family Medicine

## 2018-08-24 ENCOUNTER — Telehealth: Payer: Self-pay | Admitting: Family Medicine

## 2018-08-24 DIAGNOSIS — H409 Unspecified glaucoma: Secondary | ICD-10-CM

## 2018-08-24 NOTE — Telephone Encounter (Signed)
Referral placed to [facility]. Received a request for refill of famotidine.  Request declined as medication was discontinued by GI on 08/11/18. Patient will need to contact Dr. Myrtie Neither regarding resuming medication

## 2018-08-24 NOTE — Telephone Encounter (Signed)
Patient seen at [facility]- please review for refill

## 2018-08-24 NOTE — Telephone Encounter (Signed)
Patient's mother would like a referral to an eye specialist for patient's kierrglaucoma, states she is unable to take him all the way to [facility] and really wants for him to get his eyes checked. Please follow up.

## 2018-08-25 ENCOUNTER — Ambulatory Visit (HOSPITAL_COMMUNITY)
Admission: RE | Admit: 2018-08-25 | Discharge: 2018-08-25 | Disposition: A | Discharge status: Home / Self Care | Payer: Medicaid Other | Source: Ambulatory Visit | Attending: Gastroenterology | Admitting: Gastroenterology

## 2018-08-25 DIAGNOSIS — R1310 Dysphagia, unspecified: Secondary | ICD-10-CM

## 2018-08-25 DIAGNOSIS — G8929 Other chronic pain: Secondary | ICD-10-CM

## 2018-08-25 DIAGNOSIS — R1314 Dysphagia, pharyngoesophageal phase: Secondary | ICD-10-CM

## 2018-08-25 DIAGNOSIS — R1013 Epigastric pain: Secondary | ICD-10-CM

## 2018-08-28 ENCOUNTER — Other Ambulatory Visit (HOSPITAL_COMMUNITY): Payer: Self-pay

## 2018-08-28 DIAGNOSIS — R1310 Dysphagia, unspecified: Secondary | ICD-10-CM

## 2018-08-28 NOTE — Telephone Encounter (Signed)
Mom notified.

## 2018-09-06 ENCOUNTER — Ambulatory Visit (INDEPENDENT_AMBULATORY_CARE_PROVIDER_SITE_OTHER): Payer: Medicaid Other | Admitting: Family Medicine

## 2018-09-06 ENCOUNTER — Ambulatory Visit (HOSPITAL_COMMUNITY)
Admission: RE | Admit: 2018-09-06 | Discharge: 2018-09-06 | Disposition: A | Discharge status: Home / Self Care | Payer: Medicaid Other | Source: Ambulatory Visit | Attending: Gastroenterology | Admitting: Gastroenterology

## 2018-09-06 ENCOUNTER — Encounter: Payer: Self-pay | Admitting: Family Medicine

## 2018-09-06 VITALS — BP 107/75 | HR 82 | Resp 17 | Ht 67.0 in | Wt 189.2 lb

## 2018-09-06 DIAGNOSIS — I509 Heart failure, unspecified: Secondary | ICD-10-CM

## 2018-09-06 DIAGNOSIS — E782 Mixed hyperlipidemia: Secondary | ICD-10-CM

## 2018-09-06 DIAGNOSIS — H409 Unspecified glaucoma: Secondary | ICD-10-CM

## 2018-09-06 DIAGNOSIS — R1310 Dysphagia, unspecified: Secondary | ICD-10-CM

## 2018-09-06 DIAGNOSIS — F424 Excoriation (skin-picking) disorder: Secondary | ICD-10-CM | POA: Diagnosis not present

## 2018-09-06 DIAGNOSIS — R4189 Other symptoms and signs involving cognitive functions and awareness: Secondary | ICD-10-CM

## 2018-09-06 MED ORDER — LATANOPROST 0.005 % OP SOLN: 1.0000 [drp] | Freq: Every day | OPHTHALMIC | 1 refills | Status: DC

## 2018-09-06 MED ORDER — IPRATROPIUM BROMIDE 0.03 % NA SOLN: 2.0000 | Freq: Two times a day (BID) | NASAL | 6 refills | Status: DC

## 2018-09-06 MED ORDER — ATORVASTATIN CALCIUM 20 MG PO TABS: 20.0000 mg | ORAL_TABLET | Freq: Every day | ORAL | 11 refills | Status: DC

## 2018-09-06 NOTE — Progress Notes (Addendum)
Objective Swallowing Evaluation: Type of Study: MBS-Modified Barium Swallow Study   Patient Details  Name: Alan Owens MRN: [id number] Date of Birth: 03/12/1981  Today's Date: 09/06/2018 Time: SLP Start Time (ACUTE ONLY): 1120 -SLP Stop Time (ACUTE ONLY): 1145  SLP Time Calculation (min) (ACUTE ONLY): 25 min   Past Medical History:  Past Medical History:  Diagnosis Date  . Acid reflux   . Blind left eye   . Congestive heart failure (HCC)   . Hypercholesterolemia   . Hypertension   . Juvenile glaucoma   . Mental developmental delay   . Renal failure   . Vitamin D deficiency    Past Surgical History: No past surgical history on file. HPI: Pt is a 38 y.o. male with PMH of GERD, CHF, HTN, mental developmental delay presenting for MBSS for concerns of dysphagia and aspiration. Mother reports pt has had difficulty swallowing and often coughs while eating, which has been going on since childhood. She also reports she does not know if difficulty swallowing has ever been investigated, but does not believe he has had SLP evaluation. Pt  on puree diet at baseline. Mother puts thickener in pureed food to help it go down easier.   No data recorded   Assessment / Plan / Recommendation  CHL IP CLINICAL IMPRESSIONS 09/06/2018  Clinical Impression Pt presents with functional oral and pharyngeal swallow despite oral anatomical differences. At rest, pt's larger than normal tongue protrudes from mouth. During PO intake of solid and liquids, tip of tongue noted to bend back as if folding in half during bolus manipulation with piecemeal bolus formulation also observed. Due to edentulous status pt used tongue to mush up soft and mixed consistency solids on roof of mouth in place of chewing. No aspiration observed with thin liquid or solid PO trials. Pill with thin swallowed after a few faulty attempts and passed through cervical esophagus successfully. Recommend puree and soft solid diet with thin  liquids. Educated pt's mother regarding results, recommendations and reflux precautions. She expressed concerns with pt coughing on secretions while lying down, but was reassured this was normal body response for airway protection though this could also be result of reflux. No further f/u from SLP warranted at this time.  SLP Visit Diagnosis --  Attention and concentration deficit following --  Frontal lobe and executive function deficit following --  Impact on safety and function Mild aspiration risk      CHL IP TREATMENT RECOMMENDATION 09/06/2018  Treatment Recommendations No treatment recommended at this time     Prognosis 09/06/2018  Prognosis for Safe Diet Advancement Good  Barriers to Reach Goals Cognitive deficits  Barriers/Prognosis Comment --    CHL IP DIET RECOMMENDATION 09/06/2018  SLP Diet Recommendations Dysphagia 2 (Fine chop) solids;Dysphagia 1 (Puree) solids;Thin liquid  Liquid Administration via Other (Comment)  Medication Administration Whole meds with puree  Compensations Slow rate;Small sips/bites;Minimize environmental distractions  Postural Changes Remain semi-upright after after feeds/meals (Comment);Seated upright at 90 degrees      CHL IP OTHER RECOMMENDATIONS 09/06/2018  Recommended Consults --  Oral Care Recommendations Oral care BID  Other Recommendations --      CHL IP FOLLOW UP RECOMMENDATIONS 09/06/2018  Follow up Recommendations 24 hour supervision/assistance      No flowsheet data found.         CHL IP ORAL PHASE 09/06/2018  Oral Phase --  Oral - Pudding Teaspoon --  Oral - Pudding Cup --  Oral - Honey Teaspoon --  Oral - Honey Cup --  Oral - Nectar Teaspoon --  Oral - Nectar Cup --  Oral - Nectar Straw --  Oral - Thin Teaspoon --  Oral - Thin Cup Piecemeal swallowing  Oral - Thin Straw Piecemeal swallowing  Oral - Puree Piecemeal swallowing  Oral - Mech Soft Piecemeal swallowing  Oral - Regular --  Oral - Multi-Consistency Piecemeal  swallowing  Oral - Pill --  Oral Phase - Comment (No Data)    CHL IP PHARYNGEAL PHASE 09/06/2018  Pharyngeal Phase WFL  Pharyngeal- Pudding Teaspoon --  Pharyngeal --  Pharyngeal- Pudding Cup --  Pharyngeal --  Pharyngeal- Honey Teaspoon --  Pharyngeal --  Pharyngeal- Honey Cup --  Pharyngeal --  Pharyngeal- Nectar Teaspoon --  Pharyngeal --  Pharyngeal- Nectar Cup --  Pharyngeal --  Pharyngeal- Nectar Straw --  Pharyngeal --  Pharyngeal- Thin Teaspoon --  Pharyngeal --  Pharyngeal- Thin Cup --  Pharyngeal --  Pharyngeal- Thin Straw --  Pharyngeal --  Pharyngeal- Puree --  Pharyngeal --  Pharyngeal- Mechanical Soft --  Pharyngeal --  Pharyngeal- Regular --  Pharyngeal --  Pharyngeal- Multi-consistency --  Pharyngeal --  Pharyngeal- Pill --  Pharyngeal --  Pharyngeal Comment --     CHL IP CERVICAL ESOPHAGEAL PHASE 09/06/2018  Cervical Esophageal Phase WFL  Pudding Teaspoon --  Pudding Cup --  Honey Teaspoon --  Honey Cup --  Nectar Teaspoon --  Nectar Cup --  Nectar Straw --  Thin Teaspoon --  Thin Cup --  Thin Straw --  Puree --  Mechanical Soft --  Regular --  Multi-consistency --  Pill --  Cervical Esophageal Comment --     Izzy Jaceyon Strole,SLP Student 09/06/2018, 2:34 PM

## 2018-09-06 NOTE — Patient Instructions (Signed)
Please complete SCAT form and return to office. We will send the entire packet including the provider portion to [facility] department.  Review medication list attached and take all medications as prescribed today.

## 2018-09-10 DIAGNOSIS — R1310 Dysphagia, unspecified: Secondary | ICD-10-CM | POA: Insufficient documentation

## 2018-09-10 DIAGNOSIS — I509 Heart failure, unspecified: Secondary | ICD-10-CM | POA: Insufficient documentation

## 2018-09-10 DIAGNOSIS — E782 Mixed hyperlipidemia: Secondary | ICD-10-CM | POA: Insufficient documentation

## 2018-09-10 DIAGNOSIS — R4189 Other symptoms and signs involving cognitive functions and awareness: Secondary | ICD-10-CM | POA: Insufficient documentation

## 2018-09-10 DIAGNOSIS — H409 Unspecified glaucoma: Secondary | ICD-10-CM | POA: Insufficient documentation

## 2018-09-10 DIAGNOSIS — F424 Excoriation (skin-picking) disorder: Secondary | ICD-10-CM | POA: Insufficient documentation

## 2018-09-10 NOTE — Progress Notes (Signed)
Patient ID: Alan Owens, male    DOB: 1981/03/10, 38 y.o.   MRN: [id number]  PCP: Bing Neighbors, FNP  Chief Complaint  Patient presents with  . Medical Management of Chronic Issues    Subjective:  HPI  Alan Owens is a 38 y.o. male presents for evaluation medication refills, skin irritation, and request for [employer] Transportation form.  Mother is providing history. Patient is nonverbal. Updates: since last office visit, patient has been seen and established with Gastroenterology for evaluation of dysphagia. He has a Barium Swallow test performed today. He is scheduled to follow-up with GI on 09/11/18 for results of recent swallow test. He is currently on soft diet. Mother is grinding all meals. He is able to tolerate thicken fluids better than thinned-milk.   He is established with [facility] homecare who is providing personal care attendant per patient and home.  There is requesting an application for [employer] transportation services.  Mother reports a hardship with transporting son to and from medical appointments.  Skin irritation. Mother reports patient is picking at skin and causing irritation. This is a new behavior. He is causing excoriated lesions to occur as he is favoring his left lower forearm. Mother reports keep skin hydrated with moisturizer and he is not picking at any other areas of his skin. Social History   Socioeconomic History  . Marital status: Single    Spouse name: Not on file  . Number of children: Not on file  . Years of education: Not on file  . Highest education level: Not on file  Occupational History  . Not on file  Social Needs  . Financial resource strain: Not on file  . Food insecurity:    Worry: Not on file    Inability: Not on file  . Transportation needs:    Medical: Not on file    Non-medical: Not on file  Tobacco Use  . Smoking status: Never Smoker  . Smokeless tobacco: Never Used  Substance and Sexual Activity  . Alcohol use: Never    Frequency: Never  . Drug use: Never  . Sexual activity: Never  Lifestyle  . Physical activity:    Days per week: Not on file    Minutes per session: Not on file  . Stress: Not on file  Relationships  . Social connections:    Talks on phone: Not on file    Gets together: Not on file    Attends religious service: Not on file    Active member of club or organization: Not on file    Attends meetings of clubs or organizations: Not on file    Relationship status: Not on file  . Intimate partner violence:    Fear of current or ex partner: Not on file    Emotionally abused: Not on file    Physically abused: Not on file    Forced sexual activity: Not on file  Other Topics Concern  . Not on file  Social History Narrative  . Not on file    Family History  Problem Relation Age of Onset  . Diabetes Sibling   . Hypertension Sibling     Review of Systems  Pertinent negatives listed in HPI   There are no active problems to display for this patient.   No Known Allergies  Prior to Admission medications   Medication Sig Start Date End Date Taking? Authorizing Provider  amLODipine (NORVASC) 2.5 MG tablet Take 1 tablet (2.5 mg total) by mouth daily.  08/10/18  Yes Wendall Stade, MD  aspirin EC 81 MG tablet Take 1 tablet (81 mg total) by mouth daily. 06/06/18  Yes Bing Neighbors, FNP  atorvastatin (LIPITOR) 20 MG tablet Take 1 tablet (20 mg total) by mouth daily at 6 PM. 09/06/18  Yes Bing Neighbors, FNP  brimonidine (ALPHAGAN) 0.15 % ophthalmic solution Place 1 drop into both eyes 2 (two) times daily.   Yes [provider]  carvedilol (COREG) 6.25 MG tablet Take 1 tablet (6.25 mg total) by mouth 2 (two) times daily with a meal. 08/10/18  Yes Wendall Stade, MD  furosemide (LASIX) 20 MG tablet Take 1 tablet (20 mg total) by mouth daily. 08/10/18  Yes Wendall Stade, MD  ipratropium (ATROVENT) 0.03 % nasal spray Place 2 sprays into both nostrils 2 (two) times daily.  09/06/18  Yes Bing Neighbors, FNP  latanoprost (XALATAN) 0.005 % ophthalmic solution Place 1 drop into both eyes at bedtime. 09/06/18  Yes Bing Neighbors, FNP  lisinopril (PRINIVIL,ZESTRIL) 10 MG tablet Take 1 tablet (10 mg total) by mouth daily. 08/10/18  Yes Wendall Stade, MD  omeprazole (PRILOSEC) 20 MG capsule Take 1 capsule (20 mg total) by mouth daily. 07/13/18  Yes Bing Neighbors, FNP  potassium chloride SA (K-DUR,KLOR-CON) 20 MEQ tablet Take 1 tablet (20 mEq total) by mouth daily. 08/10/18  Yes Wendall Stade, MD    Past Medical, Surgical Family and Social History reviewed and updated.    Objective:   Today's Vitals   09/06/18 1340  BP: 107/75  Pulse: 82  Resp: 17  SpO2: 98%  Weight: 189 lb 3.2 oz (85.8 kg)  Height: 5\' 7"  (1.702 m)    Wt Readings from Last 3 Encounters:  09/06/18 189 lb 3.2 oz (85.8 kg)  08/11/18 181 lb 6 oz (82.3 kg)  07/27/18 184 lb (83.5 kg)     Physical Exam General appearance: alert, well developed, well nourished, cooperative and in no distress Head: Normocephalic, without obvious abnormality, atraumatic Respiratory: Respirations even and unlabored, normal respiratory rate Heart: rate and rhythm normal. No gallop or murmurs noted on exam  Extremities: No gross deformities Skin: Skin color, texture, turgor normal. No rashes seen  Psych: Appropriate mood and affect. Neurologic: Mental status: Alert, oriented to person, place, and time, thought content appropriate.  No results found for: POCGLU  Lab Results  Component Value Date   HGBA1C 5.7 (H) 07/13/2018    Assessment & Plan:  1. Glaucoma, unspecified glaucoma type, unspecified laterality -Patient referred to [facility] evaluation and management.  Refilled previously prescribed optic medications for glaucoma.  Advised mother to follow-up here if she has not received a call to schedule an appointment with the ophthalmologist.  2. Congestive heart failure, unspecified  HF chronicity, unspecified heart failure type (HCC) -Managed by cardiology. Asymptomatic today. Recent Echo 45-50% EF with mildly reduced systolic function.   3. Dysphagia, unspecified type -Recent Barium swallow test. F/u with GI regarding results and recommendations.  4. Cognitive impairment -baseline since birth   5. Mixed hyperlipidemia -refilled atorvastatin   6. Skin picking habit -avoidance by having him wear long sleeves or place gloves on hands -keep finger nails clipped to avoid skin infection   SCAT application provided. Patient's mother will return patient portion next week and we will mail provider and patient portion from our office.   Meds ordered this encounter  Medications  . latanoprost (XALATAN) 0.005 % ophthalmic solution  Sig: Place 1 drop into both eyes at bedtime.    Dispense:  2.5 mL    Refill:  1  . atorvastatin (LIPITOR) 20 MG tablet    Sig: Take 1 tablet (20 mg total) by mouth daily at 6 PM.    Dispense:  30 tablet    Refill:  11  . ipratropium (ATROVENT) 0.03 % nasal spray    Sig: Place 2 sprays into both nostrils 2 (two) times daily.    Dispense:  30 mL    Refill:  6   -The patient was given clear instructions to go to ER or return to medical center if symptoms do not improve, worsen or new problems develop. The patient verbalized understanding.    Joaquin Courts, FNP Primary Care at [facility] [address] [place] [address] [phone]: [phone]

## 2018-09-11 ENCOUNTER — Other Ambulatory Visit: Payer: Self-pay

## 2018-09-11 ENCOUNTER — Encounter: Payer: Self-pay | Admitting: Gastroenterology

## 2018-09-11 ENCOUNTER — Ambulatory Visit (AMBULATORY_SURGERY_CENTER): Payer: Medicaid Other | Admitting: Gastroenterology

## 2018-09-11 VITALS — BP 121/87 | HR 70 | Temp 96.2°F | Resp 19 | Ht 67.0 in | Wt 189.0 lb

## 2018-09-11 DIAGNOSIS — R1013 Epigastric pain: Secondary | ICD-10-CM

## 2018-09-11 MED ORDER — SODIUM CHLORIDE 0.9 % IV SOLN: 500.0000 mL | Freq: Once | INTRAVENOUS | Status: DC

## 2018-09-11 NOTE — Op Note (Signed)
[facility] Patient Name: Alan Owens Procedure Date: 09/11/2018 9:35 AM MRN: [id number] Endoscopist: Sherilyn Cooter L. Myrtie Neither , MD Age: 38 Referring MD:  Date of Birth: [birth year] Gender: Male Account #: [id number] Procedure:                Upper GI endoscopy Indications:              Epigastric abdominal pain Medicines:                Monitored Anesthesia Care Procedure:                Pre-Anesthesia Assessment:                           - Prior to the procedure, a History and Physical                            was performed, and patient medications and                            allergies were reviewed. The patient's tolerance of                            previous anesthesia was also reviewed. The risks                            and benefits of the procedure and the sedation                            options and risks were discussed with the patient.                            All questions were answered, and informed consent                            was obtained. Anticoagulants: The patient has taken                            aspirin. It was decided not to withhold this                            medication prior to the procedure. ASA Grade                            Assessment: III - A patient with severe systemic                            disease. After reviewing the risks and benefits,                            the patient was deemed in satisfactory condition to                            undergo the procedure.  After obtaining informed consent, the endoscope was                            passed under direct vision. Throughout the                            procedure, the patient's blood pressure, pulse, and                            oxygen saturations were monitored continuously. The                            Endoscope was introduced through the mouth, and                            advanced to the second part of duodenum. The upper                         GI endoscopy was accomplished without difficulty.                            The patient tolerated the procedure well. Scope In: Scope Out: Findings:                 The esophagus was normal.                           The stomach was normal.                           The cardia and gastric fundus were normal on                            retroflexion.                           The examined duodenum was normal. Complications:            No immediate complications. Estimated Blood Loss:     Estimated blood loss: none. Impression:               - Normal esophagus.                           - Normal stomach.                           - Normal examined duodenum.                           - No specimens collected. Recommendation:           - Patient has a contact number available for                            emergencies. The signs and symptoms of potential  delayed complications were discussed with the                            patient. Return to normal activities tomorrow.                            Written discharge instructions were provided to the                            patient.                           - Resume previous diet.                           - Discontinue omeprazole.                           - Return to my office as needed. [name] L. Myrtie Neither, MD 09/11/2018 9:48:18 AM This report has been signed electronically.

## 2018-09-11 NOTE — Patient Instructions (Signed)
YOU HAD AN ENDOSCOPIC PROCEDURE TODAY AT THE [facility]:   Refer to the procedure report that was given to you for any specific questions about what was found during the examination.  If the procedure report does not answer your questions, please call your gastroenterologist to clarify.  If you requested that your care partner not be given the details of your procedure findings, then the procedure report has been included in a sealed envelope for you to review at your convenience later.  YOU SHOULD EXPECT: Some feelings of bloating in the abdomen. Passage of more gas than usual.  Walking can help get rid of the air that was put into your GI tract during the procedure and reduce the bloating.   Please Note:  You might notice some irritation and congestion in your nose or some drainage.  This is from the oxygen used during your procedure.  There is no need for concern and it should clear up in a day or so.  SYMPTOMS TO REPORT IMMEDIATELY:    Following upper endoscopy (EGD)  Vomiting of blood or coffee ground material  New chest pain or pain under the shoulder blades  Painful or persistently difficult swallowing  New shortness of breath  Fever of 100F or higher  Black, tarry-looking stools  For urgent or emergent issues, a gastroenterologist can be reached at any hour by calling [phone]. Read all handouts given to you by your recovery room nurse.  DIET:  We do recommend a small meal at first, but then you may proceed to your regular diet.  Drink plenty of fluids but you should avoid alcoholic beverages for 24 hours.  ACTIVITY:  You should plan to take it easy for the rest of today and you should NOT DRIVE or use heavy machinery until tomorrow (because of the sedation medicines used during the test).    FOLLOW UP: Our staff will call the number listed on your records the next business day following your procedure to check on you and address any questions or concerns that  you may have regarding the information given to you following your procedure. If we do not reach you, we will leave a message.  However, if you are feeling well and you are not experiencing any problems, there is no need to return our call.  We will assume that you have returned to your regular daily activities without incident.  If any biopsies were taken you will be contacted by phone or by letter within the next 1-3 weeks.  Please call us at [phone] if you have not heard about the biopsies in 3 weeks.    SIGNATURES/CONFIDENTIALITY: You and/or your care partner have signed paperwork which will be entered into your electronic medical record.  These signatures attest to the fact that that the information above on your After Visit Summary has been reviewed and is understood.  Full responsibility of the confidentiality of this discharge information lies with you and/or your care-partner.

## 2018-09-11 NOTE — Progress Notes (Signed)
Report given to PACU, vss 

## 2018-09-12 ENCOUNTER — Telehealth: Payer: Self-pay

## 2018-09-12 NOTE — Telephone Encounter (Signed)
  Follow up Call-  Call back number 09/11/2018  Post procedure Call Back phone  # [phone]  Permission to leave phone message Yes  Some recent data might be hidden     Patient questions:  Do you have a fever, pain , or abdominal swelling? No. Pain Score  0 *  Have you tolerated food without any problems? Yes.    Have you been able to return to your normal activities? Yes.    Do you have any questions about your discharge instructions: Diet   No. Medications  No. Follow up visit  No.  Do you have questions or concerns about your Care? No.  Actions: * If pain score is 4 or above: No action needed, pain <4.

## 2018-10-18 ENCOUNTER — Other Ambulatory Visit: Payer: Self-pay | Admitting: Cardiovascular Disease

## 2018-10-18 MED ORDER — LISINOPRIL 10 MG PO TABS: 10.0000 mg | ORAL_TABLET | Freq: Every day | ORAL | 2 refills | Status: DC

## 2018-10-18 NOTE — Telephone Encounter (Signed)
Pt's representative Morene Rankins gave me permission to talk with Vicaroline napier concerning the pt's medication. Pt's medication was sent to the wrong pharmacy. I resent the medication to the correct pharmacy as requested. Confirmation received.

## 2018-11-02 ENCOUNTER — Other Ambulatory Visit: Payer: Self-pay | Admitting: Family Medicine

## 2018-11-02 NOTE — Telephone Encounter (Signed)
1) Medication(s) Requested (by name): brimonidine (ALPHAGAN) 0.15 % ophthalmic solution   2) Pharmacy of Choice: [id number] - , [place] - [address]) Special Requests:   Approved medications will be sent to the pharmacy, we will reach out if there is an issue.  Requests made after 3pm may not be addressed until the following business day!  If a patient is unsure of the name of the medication(s) please note and ask patient to call back when they are able to provide all info, do not send to responsible party until all information is available!

## 2018-11-03 MED ORDER — BRIMONIDINE TARTRATE 0.15 % OP SOLN: 1.0000 [drp] | Freq: Two times a day (BID) | OPHTHALMIC | 0 refills | Status: DC

## 2018-11-06 ENCOUNTER — Telehealth: Payer: Self-pay | Admitting: Family Medicine

## 2018-11-06 NOTE — Telephone Encounter (Signed)
Patient needs prior authorization for brimonidine (ALPHAGAN) 0.15 % ophthalmic solution [[id number]]

## 2018-11-07 MED ORDER — ALPHAGAN P 0.15 % OP SOLN: 1.0000 [drp] | Freq: Two times a day (BID) | OPHTHALMIC | 1 refills | Status: DC

## 2018-11-07 NOTE — Telephone Encounter (Signed)
Called [employer] [phone]) to initiate prior authorization. Gearldine Bienenstock states that the brand name for Brimonidine eye drops is on the preferred drug list. Generic requires PA. Sent over Alphagan P .0.15% eye drops to patient's pharmacy.

## 2018-12-10 ENCOUNTER — Other Ambulatory Visit: Payer: Self-pay | Admitting: Family Medicine

## 2018-12-11 ENCOUNTER — Other Ambulatory Visit: Payer: Self-pay | Admitting: Family Medicine

## 2019-02-17 ENCOUNTER — Other Ambulatory Visit: Payer: Self-pay | Admitting: Family Medicine

## 2019-03-07 ENCOUNTER — Ambulatory Visit: Payer: Medicaid Other | Admitting: Nurse Practitioner

## 2019-03-08 ENCOUNTER — Other Ambulatory Visit: Payer: Self-pay | Admitting: Cardiovascular Disease

## 2019-03-13 ENCOUNTER — Telehealth: Payer: Self-pay

## 2019-03-13 NOTE — Telephone Encounter (Signed)

## 2019-03-14 ENCOUNTER — Ambulatory Visit (INDEPENDENT_AMBULATORY_CARE_PROVIDER_SITE_OTHER): Payer: Medicaid Other | Admitting: Nurse Practitioner

## 2019-03-14 ENCOUNTER — Other Ambulatory Visit: Payer: Self-pay

## 2019-03-14 VITALS — BP 114/79 | HR 89 | Temp 97.3°F | Resp 17 | Ht 70.0 in | Wt 193.0 lb

## 2019-03-14 DIAGNOSIS — F424 Excoriation (skin-picking) disorder: Secondary | ICD-10-CM

## 2019-03-14 DIAGNOSIS — L981 Factitial dermatitis: Secondary | ICD-10-CM | POA: Diagnosis not present

## 2019-03-14 DIAGNOSIS — M419 Scoliosis, unspecified: Secondary | ICD-10-CM | POA: Diagnosis not present

## 2019-03-14 DIAGNOSIS — D649 Anemia, unspecified: Secondary | ICD-10-CM

## 2019-03-14 DIAGNOSIS — I10 Essential (primary) hypertension: Secondary | ICD-10-CM | POA: Diagnosis not present

## 2019-03-14 DIAGNOSIS — R7303 Prediabetes: Secondary | ICD-10-CM

## 2019-03-14 DIAGNOSIS — H409 Unspecified glaucoma: Secondary | ICD-10-CM | POA: Diagnosis not present

## 2019-03-14 MED ORDER — LATANOPROST 0.005 % OP SOLN: 1.0000 [drp] | Freq: Every day | OPHTHALMIC | 1 refills | Status: AC

## 2019-03-14 MED ORDER — FLUOXETINE HCL 20 MG PO TABS: 20.0000 mg | ORAL_TABLET | Freq: Every day | ORAL | 3 refills | Status: DC

## 2019-03-14 NOTE — Progress Notes (Signed)
Assessment & Plan:  [name] was seen today for medical management of chronic issues.  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 Will check EKG next visit for QT prolongation  Glaucoma, unspecified glaucoma type, unspecified laterality -     latanoprost (XALATAN) 0.005 % ophthalmic solution; Place 1 drop into both eyes at bedtime. -     Ambulatory referral to Ophthalmology Mother reports [facility] however needs to be seen closer in regard to transportation concerns.   Scoliosis, unspecified scoliosis type, unspecified spinal region -     DG SCOLIOSIS EVAL COMPLETE SPINE 4 OR 5 VIEWS; Future  Essential hypertension -     Basic Metabolic Panel Continue all antihypertensives as prescribed.  Remember to bring in your blood pressure log with you for your follow up appointment.  DASH/Mediterranean Diets are healthier choices for HTN.    Prediabetes -     Hemoglobin A1c Lab Results  Component Value Date   HGBA1C 5.7 (H) 07/13/2018    Anemia, unspecified type -     CBC -     Iron, TIBC and Ferritin Panel    Patient has been counseled on age-appropriate routine health concerns for screening and prevention. These are reviewed and up-to-date. Referrals have been placed accordingly. Immunizations are up-to-date or declined.    Subjective:   Chief Complaint  Patient presents with  . Medical Management of Chronic Issues   HPI Alan Owens 38 y.o. male presents to office today for follow up.  has a past medical history of Acid reflux, Blind left eye, Congestive heart failure (HCC), Hypercholesterolemia, Hypertension, Juvenile glaucoma, Mental developmental delay, Renal failure, and Vitamin D deficiency.  Accompanied by his mother today. She voices concerns of dermatillomania which started several months ago with onset of COVID pandemic. She also  states he complains of back pain and was told he has scoliosis years ago. She would like to have this evaluated.   PER PCP NOTE 06-06-2018: Mother is his caregiver.  Unfortunately his mother is a poor historian. Patient has visible cognitive deficits and at present she has requested medical records which have not arrived prior to today's visit.  She reports that he has a in-home care attendant assist with ADLs and feedings. Patient is nonverbal.  Patient is also incontinent.  Mother is unable to identify underlying cause of patient's deficits.  He reported he was born normal however then she notes that he was not immediately discharged from the hospital due to illness, which she thinks was related to breathing and possibly his heart. She is unable to tell me what type of illness patient sustained after childbirth.  According to patient mother, patient has never been verbal.  However mother reports to me today that he does speak some words that are comprehensible and sometimes incomprehensible.   Seeing Dr. Eden EmmsNishan for CHF.    Essential Hypertension Blood pressure well controlled. Taking amlodipine 2.5 mg and lisinopril 10 mg daily as prescribed. Denies chest pain, shortness of breath, palpitations, lightheadedness, dizziness, headaches or BLE edema.  BP Readings from Last 3 Encounters:  03/14/19 114/79  09/11/18 121/87  09/06/18 107/75    Review of Systems  Constitutional: Negative for fever, malaise/fatigue and weight loss.  HENT: Negative.  Negative for nosebleeds.   Eyes: Positive for blurred vision. Negative for double vision and photophobia.  Blind left eye  Respiratory: Negative.  Negative for cough and shortness of breath.   Cardiovascular: Negative.  Negative for chest pain, palpitations and leg swelling.  Gastrointestinal: Positive for heartburn. Negative for nausea and vomiting.  Musculoskeletal: Negative.  Negative for myalgias.  Skin: Positive for itching.  Neurological:  Negative.  Negative for dizziness, focal weakness, seizures and headaches.  Psychiatric/Behavioral: Negative.  Negative for suicidal ideas.       SEE HPI    Past Medical History:  Diagnosis Date  . Acid reflux   . Blind left eye   . Congestive heart failure ([place])   . Hypercholesterolemia   . Hypertension   . Juvenile glaucoma   . Mental developmental delay   . Renal failure   . Vitamin D deficiency     Past Surgical History:  Procedure Laterality Date  . MULTIPLE TOOTH EXTRACTIONS Bilateral 2009   all teeth extracted per patient's mother    Family History  Problem Relation Age of Onset  . Diabetes Sibling   . Hypertension Sibling   . Colon cancer Neg Hx   . Esophageal cancer Neg Hx   . Stomach cancer Neg Hx   . Rectal cancer Neg Hx     Social History Reviewed with no changes to be made today.   Outpatient Medications Prior to Visit  Medication Sig Dispense Refill  . ALPHAGAN P 0.15 % ophthalmic solution INSTILL 1 DROP INTO EACH EYE TWICE A DAY 5 mL 11  . amLODipine (NORVASC) 2.5 MG tablet Take 1 tablet (2.5 mg total) by mouth daily. 90 tablet 3  . ASPIRIN LOW DOSE 81 MG EC tablet TAKE 1 TABLET BY MOUTH EVERY DAY 30 tablet 6  . atorvastatin (LIPITOR) 20 MG tablet Take 1 tablet (20 mg total) by mouth daily at 6 PM. 30 tablet 11  . carvedilol (COREG) 6.25 MG tablet Take 1 tablet (6.25 mg total) by mouth 2 (two) times daily with a meal. 180 tablet 3  . furosemide (LASIX) 20 MG tablet TAKE 1 TABLET BY MOUTH EVERY DAY 90 tablet 1  . ipratropium (ATROVENT) 0.03 % nasal spray Place 2 sprays into both nostrils 2 (two) times daily. 30 mL 6  . lisinopril (PRINIVIL,ZESTRIL) 10 MG tablet Take 1 tablet (10 mg total) by mouth daily. 90 tablet 2  . omeprazole (PRILOSEC) 20 MG capsule Take 1 capsule (20 mg total) by mouth daily. 30 capsule 0  . potassium chloride SA (K-DUR,KLOR-CON) 20 MEQ tablet Take 1 tablet (20 mEq total) by mouth daily. 90 tablet 3  . latanoprost (XALATAN) 0.005 %  ophthalmic solution Place 1 drop into both eyes at bedtime. (Patient not taking: Reported on 03/14/2019) 2.5 mL 1   No facility-administered medications prior to visit.     No Known Allergies     Objective:    BP 114/79   Pulse 89   Temp (!) 97.3 F (36.3 C) (Temporal)   Resp 17   Ht 5\' 10"  (1.778 m)   Wt 193 lb (87.5 kg)   SpO2 97%   BMI 27.69 kg/m  Wt Readings from Last 3 Encounters:  03/14/19 193 lb (87.5 kg)  09/11/18 189 lb (85.7 kg)  09/06/18 189 lb 3.2 oz (85.8 kg)    Physical Exam Vitals signs and nursing note reviewed.  Constitutional:      Appearance: He is well-developed.  HENT:     Head: Normocephalic and atraumatic.  Neck:     Musculoskeletal: Normal range of motion.  Cardiovascular:  Rate and Rhythm: Normal rate and regular rhythm.     Heart sounds: Normal heart sounds. No murmur. No friction rub. No gallop.   Pulmonary:     Effort: Pulmonary effort is normal. No tachypnea or respiratory distress.     Breath sounds: Normal breath sounds. No decreased breath sounds, wheezing, rhonchi or rales.  Chest:     Chest wall: No tenderness.  Abdominal:     General: Bowel sounds are normal.     Palpations: Abdomen is soft.  Musculoskeletal: Normal range of motion.  Skin:    General: Skin is warm and dry.     Comments: Numerous areas of healed excoriated skin.   Neurological:     Mental Status: He is alert and oriented to person, place, and time.     Coordination: Coordination normal.  Psychiatric:        Behavior: Behavior is cooperative.        Patient has been counseled extensively about nutrition and exercise as well as the importance of adherence with medications and regular follow-up. The patient was given clear instructions to go to ER or return to medical center if symptoms don't improve, worsen or new problems develop. The patient verbalized understanding.   Follow-up: Return for 3 weeks follow up.PRURITIS and EKG.   Claiborne Rigg,  FNP-BC [facility] [place], [place] [phone]   03/14/2019, 6:02 PM

## 2019-03-14 NOTE — Patient Instructions (Signed)
Scoliosis  Scoliosis is a condition in which the spine curves sideways. Normally, the spine does not curve side-to-side (laterally). With scoliosis, the spine may curve to the left, to the right, or in both directions. The curve of the spine is measured by angles in degrees. Scoliosis can affect people at any age, but it is more common among children and adolescents. What are the causes? The cause of scoliosis is not always known. It may be caused by:  A birth defect.  A disease that can cause problems in the muscles or imbalance of the body, such as cerebral palsy or muscular dystrophy. What are the signs or symptoms? This condition may not cause any symptoms. If you do have symptoms, they may include:  Leaning to one side.  Sunken chest and uneven shoulders.  One side of the body being different or larger than the other side (asymmetry).  An abnormal curve in the back.  Pain, which may limit physical activity.  Shortness of breath.  Bowel or bladder control problems, such as not knowing when you have to go. This can be a sign of nerve damage. How is this diagnosed? This condition is diagnosed based on:  Your medical history.  Your symptoms.  A physical exam. This may include: ? Examining your nerves, muscles, and reflexes (neurological exam). ? Testing the movement of your spine (range of motion study).  Imaging tests, such as: ? X-rays. ? MRI. How is this treated? Treatment for this condition depends on the severity of the symptoms. Treatment may include:  Observation to make sure that your scoliosis does not get worse (progress). You may need to have regular visits with your health care provider.  A back brace to prevent scoliosis from progressing. This may be needed during times of fast growth (growth spurts), such as during adolescence.  Medicine to help relieve pain.  Physical therapy.  Surgery. Follow these instructions at home: If you have a brace:   Wear the brace as told by your health care provider. Remove it only as told by your health care provider.  Loosen the brace if your fingers or toes tingle, become numb, or turn cold and blue.  Keep the brace clean.  If the brace is not waterproof: ? Do not let it get wet. ? Cover it with a watertight covering when you take a bath or a shower. General instructions  Take over-the-counter and prescription medicines only as told by your health care provider.  Donot drive or use heavy machinery while taking prescription pain medicine.  If physical therapy was prescribed, do exercises as instructed.  Before starting any new sports or physical activities, ask your health care provider whether they are safe for you.  Keep all follow-up visits as told by your health care provider. This is important. Contact a health care provider if you have:  Problems with your back brace, such as skin irritation or discomfort.  Back pain that does not get better with medicine. Get help right away if:  Your legs feel weak.  You cannot move your legs.  You cannot control when you urinate or pass stool (loss of bladder or bowel control). Summary  Scoliosis is a condition of having a spine that curves sideways. The spine may curve to the left, to the right, or in both directions.  This condition may be caused by birth defects or diseases that affect muscles and body balance.  Follow your health care provider's instructions about wearing a brace, doing physical   activities, and keeping follow-up visits. This information is not intended to replace advice given to you by your health care provider. Make sure you discuss any questions you have with your health care provider. Document Released: 06/25/2000 Document Revised: 11/28/2017 Document Reviewed: 10/13/2017 Elsevier Patient Education  2020 Elsevier Inc.  

## 2019-03-15 ENCOUNTER — Telehealth: Payer: Self-pay | Admitting: Family Medicine

## 2019-03-15 LAB — BASIC METABOLIC PANEL
BUN/Creatinine Ratio: 9 (ref 9–20)
BUN: 10 mg/dL (ref 6–24)
CO2: 21 mmol/L (ref 20–29)
Calcium: 10.3 mg/dL — ABNORMAL HIGH (ref 8.7–10.2)
Chloride: 102 mmol/L (ref 96–106)
Creatinine, Ser: 1.17 mg/dL (ref 0.76–1.27)
GFR calc Af Amer: 90 mL/min/{1.73_m2} (ref >59)
GFR calc non Af Amer: 78 mL/min/{1.73_m2} (ref >59)
Glucose: 113 mg/dL — ABNORMAL HIGH (ref 65–99)
Potassium: 4.1 mmol/L (ref 3.5–5.2)
Sodium: 139 mmol/L (ref 134–144)

## 2019-03-15 LAB — CBC
Hematocrit: 37.4 % — ABNORMAL LOW (ref 37.5–51.0)
Hemoglobin: 12.5 g/dL — ABNORMAL LOW (ref 13.0–17.7)
MCH: 31.2 pg (ref 26.6–33.0)
MCHC: 33.4 g/dL (ref 31.5–35.7)
MCV: 93 fL (ref 79–97)
Platelets: 278 10*3/uL (ref 150–450)
RBC: 4.01 x10E6/uL — ABNORMAL LOW (ref 4.14–5.80)
RDW: 12.8 % (ref 11.6–15.4)
WBC: 4.7 10*3/uL (ref 3.4–10.8)

## 2019-03-15 LAB — IRON,TIBC AND FERRITIN PANEL
Ferritin: 516 ng/mL — ABNORMAL HIGH (ref 30–400)
Iron Saturation: 22 % (ref 15–55)
Iron: 70 ug/dL (ref 38–169)
Total Iron Binding Capacity: 313 ug/dL (ref 250–450)
UIBC: 243 ug/dL (ref 111–343)

## 2019-03-15 LAB — HEMOGLOBIN A1C
Est. average glucose Bld gHb Est-mCnc: 108 mg/dL
Hgb A1c MFr Bld: 5.4 % (ref 4.8–5.6)

## 2019-03-15 MED ORDER — FLUOXETINE HCL 20 MG PO CAPS: 20.0000 mg | ORAL_CAPSULE | Freq: Every day | ORAL | 3 refills | Status: DC

## 2019-03-15 NOTE — Telephone Encounter (Signed)
Checked Medicaid preferred drug list. They prefer Prozac in the capsule form instead of tablet. Sent corrected Rx to pharmacy.

## 2019-03-15 NOTE — Telephone Encounter (Signed)
Pt insurance does not cover Prozac, please send alternative to pts pharmacy.

## 2019-03-20 ENCOUNTER — Other Ambulatory Visit: Payer: Self-pay

## 2019-03-20 ENCOUNTER — Telehealth: Payer: Self-pay | Admitting: Family Medicine

## 2019-03-20 DIAGNOSIS — D649 Anemia, unspecified: Secondary | ICD-10-CM

## 2019-03-20 DIAGNOSIS — Z1211 Encounter for screening for malignant neoplasm of colon: Secondary | ICD-10-CM

## 2019-03-20 NOTE — Telephone Encounter (Signed)
Pt spouse calling in regards to lab results

## 2019-03-20 NOTE — Progress Notes (Signed)
Patient's mother notified of results & recommendations. Expressed understanding. Patient's mother has an appointment on 03/21/2019 & she will pick up the collection kit at her appointment.

## 2019-03-30 ENCOUNTER — Other Ambulatory Visit: Payer: Self-pay

## 2019-03-31 LAB — SPECIMEN STATUS REPORT

## 2019-03-31 LAB — FECAL OCCULT BLOOD, IMMUNOCHEMICAL: Fecal Occult Bld: NEGATIVE

## 2019-04-04 ENCOUNTER — Ambulatory Visit (INDEPENDENT_AMBULATORY_CARE_PROVIDER_SITE_OTHER): Payer: Medicaid Other | Admitting: Nurse Practitioner

## 2019-04-04 DIAGNOSIS — L298 Other pruritus: Secondary | ICD-10-CM

## 2019-04-04 DIAGNOSIS — L981 Factitial dermatitis: Secondary | ICD-10-CM

## 2019-04-04 DIAGNOSIS — F424 Excoriation (skin-picking) disorder: Secondary | ICD-10-CM

## 2019-04-04 MED ORDER — HYDROXYZINE HCL 50 MG PO TABS: 50.0000 mg | ORAL_TABLET | Freq: Three times a day (TID) | ORAL | 2 refills | Status: DC | PRN

## 2019-04-04 NOTE — Progress Notes (Signed)
Mom states that the itching & picking at his arms hasn't improved.

## 2019-04-04 NOTE — Progress Notes (Signed)
Virtual Visit via Telephone Note Due to national recommendations of social distancing due to [place] 19, telehealth visit is felt to be most appropriate for this patient at this time.  I discussed the limitations, risks, security and privacy concerns of performing an evaluation and management service by telephone and the availability of in person appointments. I also discussed with the patient that there may be a patient responsible charge related to this service. The patient expressed understanding and agreed to proceed.    I connected with Alan Owens on 04/04/19  at  11:10 AM EDT  EDT by telephone and verified that I am speaking with the correct person using two identifiers.   Consent I discussed the limitations, risks, security and privacy concerns of performing an evaluation and management service by telephone and the availability of in person appointments. I also discussed with the patient that there may be a patient responsible charge related to this service. The patient expressed understanding and agreed to proceed.   Location of Patient: Private Residence   Location of Provider: [place] participating in Telemedicine visit: Geryl Rankins FNP-BC [place]  MOTHER: [name]   History of Present Illness: Telemedicine visit for: Dermatillomania  He was prescribed prozac 20mg  daily for dermatillomania several weeks ago. Today his mother states he still continues to pick and scratch at his skin despite her giving him his prozac every day. Will add hydroxyzine. If continues will draw labs for LFTs.    Past Medical History:  Diagnosis Date  . Acid reflux   . Blind left eye   . Congestive heart failure ([facility])   . Hypercholesterolemia   . Hypertension   . Juvenile glaucoma   . Mental developmental delay   . Renal failure   . Vitamin D deficiency     Past Surgical History:  Procedure Laterality Date   . MULTIPLE TOOTH EXTRACTIONS Bilateral 2009   all teeth extracted per patient's mother    Family History  Problem Relation Age of Onset  . Diabetes Sibling   . Hypertension Sibling   . Colon cancer Neg Hx   . Esophageal cancer Neg Hx   . Stomach cancer Neg Hx   . Rectal cancer Neg Hx     Social History   Socioeconomic History  . Marital status: Single    Spouse name: Not on file  . Number of children: Not on file  . Years of education: Not on file  . Highest education level: Not on file  Occupational History  . Not on file  Social Needs  . Financial resource strain: Not on file  . Food insecurity    Worry: Not on file    Inability: Not on file  . Transportation needs    Medical: Not on file    Non-medical: Not on file  Tobacco Use  . Smoking status: Never Smoker  . Smokeless tobacco: Never Used  Substance and Sexual Activity  . Alcohol use: Never    Frequency: Never  . Drug use: Never  . Sexual activity: Never  Lifestyle  . Physical activity    Days per week: Not on file    Minutes per session: Not on file  . Stress: Not on file  Relationships  . Social Herbalist on phone: Not on file    Gets together: Not on file    Attends religious service: Not on file  Active member of club or organization: Not on file    Attends meetings of clubs or organizations: Not on file    Relationship status: Not on file  Other Topics Concern  . Not on file  Social History Narrative  . Not on file     Observations/Objective: Awake, alert and oriented x 3   Review of Systems  Constitutional: Negative for fever, malaise/fatigue and weight loss.  HENT: Negative.  Negative for nosebleeds.   Eyes:       Blind left eye  Respiratory: Negative.  Negative for cough and shortness of breath.   Cardiovascular: Negative.  Negative for chest pain, palpitations and leg swelling.  Gastrointestinal: Positive for heartburn. Negative for nausea and vomiting.   Musculoskeletal: Negative.  Negative for myalgias.  Skin: Positive for itching. Negative for rash.  Neurological: Negative.  Negative for dizziness, focal weakness, seizures and headaches.  Psychiatric/Behavioral: Negative.  Negative for suicidal ideas.       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     Follow Up Instructions Return if symptoms worsen or fail to improve.     I discussed the assessment and treatment plan with the patient. The patient was provided an opportunity to ask questions and all were answered. The patient agreed with the plan and demonstrated an understanding of the instructions.   The patient was advised to call back or seek an in-person evaluation if the symptoms worsen or if the condition fails to improve as anticipated.  I provided 18 minutes of non-face-to-face time during this encounter including median intraservice time, reviewing previous notes, labs, imaging, medications and explaining diagnosis and management.  Claiborne Rigg, FNP-BC

## 2019-04-12 ENCOUNTER — Other Ambulatory Visit: Payer: Self-pay | Admitting: Cardiovascular Disease

## 2019-04-12 MED ORDER — CARVEDILOL 6.25 MG PO TABS: 6.2500 mg | ORAL_TABLET | Freq: Two times a day (BID) | ORAL | 0 refills | Status: DC

## 2019-07-19 ENCOUNTER — Other Ambulatory Visit: Payer: Self-pay | Admitting: Cardiovascular Disease

## 2019-08-02 ENCOUNTER — Other Ambulatory Visit: Payer: Self-pay | Admitting: Nurse Practitioner

## 2019-08-02 ENCOUNTER — Other Ambulatory Visit: Payer: Self-pay | Admitting: Cardiovascular Disease

## 2019-08-02 DIAGNOSIS — F424 Excoriation (skin-picking) disorder: Secondary | ICD-10-CM

## 2019-08-08 ENCOUNTER — Ambulatory Visit: Payer: Medicaid Other

## 2019-08-10 ENCOUNTER — Telehealth: Payer: Self-pay

## 2019-08-10 NOTE — Telephone Encounter (Signed)

## 2019-08-13 ENCOUNTER — Encounter: Payer: Self-pay | Admitting: Internal Medicine

## 2019-08-13 ENCOUNTER — Ambulatory Visit (INDEPENDENT_AMBULATORY_CARE_PROVIDER_SITE_OTHER): Payer: Medicaid Other | Admitting: Internal Medicine

## 2019-08-13 ENCOUNTER — Other Ambulatory Visit: Payer: Self-pay

## 2019-08-13 VITALS — BP 118/77 | HR 77 | Temp 97.2°F | Resp 18 | Ht 70.0 in | Wt 196.8 lb

## 2019-08-13 DIAGNOSIS — F424 Excoriation (skin-picking) disorder: Secondary | ICD-10-CM | POA: Diagnosis not present

## 2019-08-13 DIAGNOSIS — Z0000 Encounter for general adult medical examination without abnormal findings: Secondary | ICD-10-CM | POA: Diagnosis not present

## 2019-08-13 DIAGNOSIS — Z029 Encounter for administrative examinations, unspecified: Secondary | ICD-10-CM

## 2019-08-13 NOTE — Progress Notes (Signed)
  Subjective:    Alan Owens - 39 y.o. male MRN [id number]  Date of birth: 07-14-1980  HPI  Alan Owens is here for annual exam. No acute concerns other than chronic skin picking. Is giving Hydroxyzine 50 mg BID. Continued with Prozac 20 mg. Is able to be redirected when caregiver says to stop. No concerns for wounds that appear infected currently. Afebrile. No vomiting.      -  reports that he has never smoked. He has never used smokeless tobacco. - Review of Systems: Per HPI. - Past Medical History: Patient Active Problem List   Diagnosis Date Noted  . Glaucoma 09/10/2018  . Congestive heart failure (HCC) 09/10/2018  . Dysphagia 09/10/2018  . Mixed hyperlipidemia 09/10/2018  . Cognitive impairment 09/10/2018  . Skin picking habit 09/10/2018   - Medications: reviewed and updated   Objective:   Physical Exam BP 118/77   Pulse 77   Temp (!) 97.2 F (36.2 C) (Temporal)   Resp 18   Ht 5\' 10"  (1.778 m)   Wt 196 lb 12.8 oz (89.3 kg)   SpO2 100%   BMI 28.24 kg/m  Physical Exam  Constitutional: He is well-developed, well-nourished, and in no distress.  HENT:  Head: Normocephalic and atraumatic.  Mouth/Throat: Oropharynx is clear and moist.  TMs normal bilaterally.  Teeth are absent. No oral lesions visualized.   Eyes: Pupils are equal, round, and reactive to light. Conjunctivae and EOM are normal.  Neck: No thyromegaly present.  Cardiovascular: Normal rate, regular rhythm, normal heart sounds and intact distal pulses.  No murmur heard. Pulmonary/Chest: Effort normal and breath sounds normal. No respiratory distress. He has no wheezes.  Abdominal: Soft. Bowel sounds are normal. He exhibits no distension. There is no abdominal tenderness. There is no rebound and no guarding.  Musculoskeletal:        General: No deformity or edema. Normal range of motion.     Cervical back: Normal range of motion and neck supple.  Lymphadenopathy:    He has no cervical adenopathy.   Neurological: He is alert. Gait normal.  Follows basic commands.   Skin: Skin is warm and dry. He is not diaphoretic.  Evidence of chronic skin excoriations and scabs. No evidence of secondary bacterial infection.   Psychiatric:  Non verbal. Delayed cognition.            Assessment & Plan:   1. Annual physical exam Reviewed that recent blood work within normal limits. Patient to f/u with cardiology for CHF. Preventative health care measures reviewed.   2. Skin picking habit Chronic. Discussed could increase Hydroxyzine to 4x per day or discussed could try benadryl in place of Hydroxyzine but not to use in combo. Monitor for wound infections.   3. Encounters for administrative purpose Paperwork filled out for adult services to receive home health aid. Will be faxed by office staff.     , D.O. 08/13/2019, 1:48 PM Primary Care at [facility]

## 2019-08-13 NOTE — Patient Instructions (Signed)

## 2019-09-05 ENCOUNTER — Telehealth: Payer: Self-pay | Admitting: *Deleted

## 2019-09-05 NOTE — Progress Notes (Signed)
Telehealth Visit     Virtual Visit via Video Note   This visit type was conducted due to national recommendations for restrictions regarding the COVID-19 Pandemic (e.g. social distancing) in an effort to limit this patient's exposure and mitigate transmission in our community.  Due to his co-morbid illnesses, this patient is at least at moderate risk for complications without adequate follow up.  This format is felt to be most appropriate for this patient at this time.  All issues noted in this document were discussed and addressed.  A limited physical exam was performed with this format.  Please refer to the patient's chart for his consent to telehealth for [facility].   Evaluation Performed:  Follow-up visit  This visit type was conducted due to national recommendations for restrictions regarding the COVID-19 Pandemic (e.g. social distancing).  This format is felt to be most appropriate for this patient at this time.  All issues noted in this document were discussed and addressed.  No physical exam was performed (except for noted visual exam findings with Video Visits).  Please refer to the patient's chart (MyChart message for video visits and phone note for telephone visits) for the patient's consent to telehealth for [facility].  Date:  09/12/2019   ID:  Alan Owens, Alan Owens July 01, 1981, MRN [id number]  Patient Location:  Home  Provider location:   Home  PCP:  Alan Market, DO  Cardiologist: Alan Owens Electrophysiologist:  None   Chief Complaint:  Follow up  History of Present Illness:    Alan Owens is a 39 y.o. male who presents via audio/video conferencing for a telehealth visit today.  Seen for Dr. Eden Owens.   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 Needs aid for feeding. Mother cares for him but known little history. Moved here from [place]. On Medicaid. On both HTN and HLD medicines.   Last seen in January  of 2020 - noted to records about his history available. 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 Echo was updated and EF was 45 to 50%. He was continued on medical management.   The patient does not have symptoms concerning for COVID-19 infection (fever, chills, cough, or new shortness of breath).   Seen today by telephone call. Mother Alan Owens) has consented for this visit. [name] does not speak. Everything has been ok with him. No chest pain. Not short of breath. She is having trouble getting refills. She says he does fine as long as she restricts his salt and gets his medicines. She is hopeful that if the pandemic slows down she can bring him in to the office - he does need lab work.   Past Medical History:  Diagnosis Date  . Acid reflux   . Blind left eye   . Congestive heart failure (HCC)   . Hypercholesterolemia   . Hypertension   . Juvenile glaucoma   . Mental developmental delay   . Renal failure   . Vitamin D deficiency    Past Surgical History:  Procedure Laterality Date  . MULTIPLE TOOTH EXTRACTIONS Bilateral 2009   all teeth extracted per patient's mother     Current Meds  Medication Sig  . ALPHAGAN P 0.15 % ophthalmic solution INSTILL 1 DROP INTO EACH EYE TWICE A DAY  . amLODipine (NORVASC) 2.5 MG tablet Take 1 tablet (2.5 mg total) by  mouth daily.  . ASPIRIN LOW DOSE 81 MG EC tablet TAKE 1 TABLET BY MOUTH EVERY DAY  . atorvastatin (LIPITOR) 20 MG tablet Take 1 tablet (20 mg total) by mouth daily at 6 PM.  . carvedilol (COREG) 6.25 MG tablet Take 1 tablet (6.25 mg total) by mouth 2 (two) times daily with a meal.  . FLUoxetine (PROZAC) 20 MG capsule TAKE 1 CAPSULE BY MOUTH EVERY DAY  . furosemide (LASIX) 20 MG tablet Take 1 tablet (20 mg total) by mouth daily.  . hydrOXYzine (ATARAX/VISTARIL) 50 MG tablet TAKE 1 TABLET (50 MG TOTAL) BY MOUTH 3 (THREE) TIMES DAILY AS NEEDED FOR ANXIETY OR ITCHING.  Marland Kitchen  ipratropium (ATROVENT) 0.03 % nasal spray Place 2 sprays into both nostrils 2 (two) times daily.  Marland Kitchen latanoprost (XALATAN) 0.005 % ophthalmic solution Place 1 drop into both eyes at bedtime.  Marland Kitchen lisinopril (ZESTRIL) 10 MG tablet Take 1 tablet (10 mg total) by mouth daily.  Marland Kitchen omeprazole (PRILOSEC) 20 MG capsule Take 1 capsule (20 mg total) by mouth daily.  . potassium chloride SA (KLOR-CON) 20 MEQ tablet Take 1 tablet (20 mEq total) by mouth daily.  . [DISCONTINUED] amLODipine (NORVASC) 2.5 MG tablet TAKE 1 TABLET BY MOUTH DAILY. PLEASE MAKE OVERDUE APPT BEFORE ANYMORE REFILLS. 1ST ATTEMPT  . [DISCONTINUED] amLODipine (NORVASC) 2.5 MG tablet Take 2.5 mg by mouth daily.  . [DISCONTINUED] atorvastatin (LIPITOR) 20 MG tablet Take 1 tablet (20 mg total) by mouth daily at 6 PM.  . [DISCONTINUED] carvedilol (COREG) 6.25 MG tablet TAKE 1 TABLET BY MOUTH 2 TIMES DAILY W/A MEAL. PLEASE MAKE OVERDUE APPT BEFORE REFILLS. 1ST ATTEMPT  . [DISCONTINUED] carvedilol (COREG) 6.25 MG tablet Take 6.25 mg by mouth 2 (two) times daily with a meal.  . [DISCONTINUED] furosemide (LASIX) 20 MG tablet TAKE 1 TABLET BY MOUTH EVERY DAY  . [DISCONTINUED] lisinopril (PRINIVIL,ZESTRIL) 10 MG tablet Take 1 tablet (10 mg total) by mouth daily.  . [DISCONTINUED] Potassium Chloride ER 20 MEQ TBCR TAKE 1 TABLET BY MOUTH DAILY. PLEASE MAKE OVERDUE APPT BEFORE ANYMORE REFILLS. 1ST ATTEMPT  . [DISCONTINUED] potassium chloride SA (KLOR-CON) 20 MEQ tablet Take 20 mEq by mouth daily.     Allergies:   Patient has no known allergies.   Social History   Tobacco Use  . Smoking status: Never Smoker  . Smokeless tobacco: Never Used  Substance Use Topics  . Alcohol use: Never  . Drug use: Never     Family Hx: The patient's family history includes Diabetes in his sibling; Hypertension in his sibling. There is no history of Colon cancer, Esophageal cancer, Stomach cancer, or Rectal cancer.  ROS:   Please see the history of present  illness.   All other systems reviewed are negative.    Objective:    Vital Signs:  BP 130/84   Pulse 92   Ht 5\' 7"  (1.702 m)   Wt 196 lb (88.9 kg)   BMI 30.70 kg/m    Wt Readings from Last 3 Encounters:  09/12/19 196 lb (88.9 kg)  08/13/19 196 lb 12.8 oz (89.3 kg)  03/14/19 193 lb (87.5 kg)    I did not speak with the patient.   Labs/Other Tests and Data Reviewed:    Lab Results  Component Value Date   WBC 4.7 03/14/2019   HGB 12.5 (L) 03/14/2019   HCT 37.4 (L) 03/14/2019   PLT 278 03/14/2019   GLUCOSE 113 (H) 03/14/2019   CHOL 109 07/13/2018   TRIG 83  07/13/2018   HDL 39 (L) 07/13/2018   LDLCALC 53 07/13/2018   ALT 19 07/13/2018   AST 20 07/13/2018   NA 139 03/14/2019   K 4.1 03/14/2019   CL 102 03/14/2019   CREATININE 1.17 03/14/2019   BUN 10 03/14/2019   CO2 21 03/14/2019   TSH 1.530 07/13/2018   HGBA1C 5.4 03/14/2019     BNP (last 3 results) No results for input(s): BNP in the last 8760 hours.  ProBNP (last 3 results) No results for input(s): PROBNP in the last 8760 hours.    Prior CV studies:    The following studies were reviewed today:   ECHO IMPRESSIONS 07/2018  1. The left ventricle appears to be normal in size, have normal wall  thickness, with mildly reduced systolic function of 45-50%. Echo evidence  of normal in diastolic filling patterns.  2. LV global hypokinesis.  3. The right ventricle is normal in size, has normal wall thickness and  normal systolic function.  4. Normal left atrial size.  5. Normal right atrial size.  6. The mitral valve normal in structure and function.  7. Normal tricuspid valve.  8. Aortic valve tricuspid.  9. Pulmonic valve regurgitation is mild by color flow Doppler.  10. The ascending aorta and aortic rootare normal is size and structure.  11. No atrial level shunt detected by color flow Doppler.    ASSESSMENT & PLAN:    1.  Congenital heart disease - no specifics noted  2. Dilated CM - no  worrisome symptoms.   3 HTN - BP is ok - medicines refilled   4. HLD - on statin - will plan to check all his labs at next visit.   5. COVID-19 Education: The signs and symptoms of COVID-19 were discussed with the patient and how to seek care for testing (follow up with PCP or arrange E-visit).  The importance of social distancing, staying at home, hand hygiene and wearing a mask when out in public were discussed today.  Patient Risk:   After full review of this patient's clinical status, I feel that they are at least moderate risk at this time.  Time:   Today, I have spent 7 minutes with the patient with telehealth technology discussing the above issues.     Medication Adjustments/Labs and Tests Ordered: Current medicines are reviewed at length with the patient today.  Concerns regarding medicines are outlined above.   Tests Ordered: No orders of the defined types were placed in this encounter.   Medication Changes: Meds ordered this encounter  Medications  . potassium chloride SA (KLOR-CON) 20 MEQ tablet    Sig: Take 1 tablet (20 mEq total) by mouth daily.    Dispense:  90 tablet    Refill:  3  . lisinopril (ZESTRIL) 10 MG tablet    Sig: Take 1 tablet (10 mg total) by mouth daily.    Dispense:  90 tablet    Refill:  3  . carvedilol (COREG) 6.25 MG tablet    Sig: Take 1 tablet (6.25 mg total) by mouth 2 (two) times daily with a meal.    Dispense:  180 tablet    Refill:  3  . amLODipine (NORVASC) 2.5 MG tablet    Sig: Take 1 tablet (2.5 mg total) by mouth daily.    Dispense:  90 tablet    Refill:  3  . furosemide (LASIX) 20 MG tablet    Sig: Take 1 tablet (20 mg total) by mouth  daily.    Dispense:  90 tablet    Refill:  3    Order Specific Question:   Supervising Provider    Answer:   [place], PETER M [[id number]]  . atorvastatin (LIPITOR) 20 MG tablet    Sig: Take 1 tablet (20 mg total) by mouth daily at 6 PM.    Dispense:  90 tablet    Refill:  3    Order Specific  Question:   Supervising Provider    Answer:   [place], PETER M [[id number]]    Disposition:  FU with [place] in 3 to 4 months. Lab on return.      Patient is agreeable to this plan and will call if any problems develop in the interim.   Amie Critchley, NP  09/12/2019 2:51 PM     [facility]

## 2019-09-05 NOTE — Telephone Encounter (Signed)

## 2019-09-08 ENCOUNTER — Other Ambulatory Visit: Payer: Self-pay | Admitting: Family Medicine

## 2019-09-08 ENCOUNTER — Other Ambulatory Visit: Payer: Self-pay | Admitting: Cardiovascular Disease

## 2019-09-08 DIAGNOSIS — F424 Excoriation (skin-picking) disorder: Secondary | ICD-10-CM

## 2019-09-10 ENCOUNTER — Other Ambulatory Visit: Payer: Self-pay | Admitting: Cardiovascular Disease

## 2019-09-10 NOTE — Telephone Encounter (Signed)
  *  STAT* If patient is at the pharmacy, call can be transferred to refill team.   1. Which medications need to be refilled? (please list name of each medication and dose if known) carvedilol (COREG) 6.25 MG tablet  2. Which pharmacy/location (including street and city if local pharmacy) is medication to be sent to? [id number] - [place], [place] - [address]  3. Do they need a 30 day or 90 day supply? 90

## 2019-09-11 NOTE — Telephone Encounter (Signed)
Pt has appointment to see Norma Fredrickson, NP tomorrow. Refills will be addressed at visit

## 2019-09-12 ENCOUNTER — Ambulatory Visit: Payer: Medicaid Other

## 2019-09-12 ENCOUNTER — Encounter: Payer: Self-pay | Admitting: Nurse Practitioner

## 2019-09-12 ENCOUNTER — Telehealth (INDEPENDENT_AMBULATORY_CARE_PROVIDER_SITE_OTHER): Payer: Medicaid Other | Admitting: Nurse Practitioner

## 2019-09-12 ENCOUNTER — Other Ambulatory Visit: Payer: Self-pay

## 2019-09-12 VITALS — BP 130/84 | HR 92 | Ht 67.0 in | Wt 196.0 lb

## 2019-09-12 DIAGNOSIS — I420 Dilated cardiomyopathy: Secondary | ICD-10-CM | POA: Diagnosis not present

## 2019-09-12 DIAGNOSIS — Z7189 Other specified counseling: Secondary | ICD-10-CM

## 2019-09-12 DIAGNOSIS — I429 Cardiomyopathy, unspecified: Secondary | ICD-10-CM

## 2019-09-12 DIAGNOSIS — E785 Hyperlipidemia, unspecified: Secondary | ICD-10-CM | POA: Diagnosis not present

## 2019-09-12 DIAGNOSIS — Q249 Congenital malformation of heart, unspecified: Secondary | ICD-10-CM

## 2019-09-12 DIAGNOSIS — I5022 Chronic systolic (congestive) heart failure: Secondary | ICD-10-CM

## 2019-09-12 DIAGNOSIS — I517 Cardiomegaly: Secondary | ICD-10-CM

## 2019-09-12 DIAGNOSIS — I10 Essential (primary) hypertension: Secondary | ICD-10-CM | POA: Diagnosis not present

## 2019-09-12 MED ORDER — CARVEDILOL 6.25 MG PO TABS: 6.2500 mg | ORAL_TABLET | Freq: Two times a day (BID) | ORAL | 3 refills | Status: DC

## 2019-09-12 MED ORDER — FUROSEMIDE 20 MG PO TABS: 20.0000 mg | ORAL_TABLET | Freq: Every day | ORAL | 3 refills | Status: DC

## 2019-09-12 MED ORDER — POTASSIUM CHLORIDE CRYS ER 20 MEQ PO TBCR: 20.0000 meq | EXTENDED_RELEASE_TABLET | Freq: Every day | ORAL | 3 refills | Status: DC

## 2019-09-12 MED ORDER — ATORVASTATIN CALCIUM 20 MG PO TABS: 20.0000 mg | ORAL_TABLET | Freq: Every day | ORAL | 3 refills | Status: DC

## 2019-09-12 MED ORDER — AMLODIPINE BESYLATE 2.5 MG PO TABS: 2.5000 mg | ORAL_TABLET | Freq: Every day | ORAL | 3 refills | Status: DC

## 2019-09-12 MED ORDER — LISINOPRIL 10 MG PO TABS: 10.0000 mg | ORAL_TABLET | Freq: Every day | ORAL | 3 refills | Status: DC

## 2019-09-12 NOTE — Patient Instructions (Addendum)
After Visit Summary:  We will be checking the following labs today - NONE - we will do this when we see him in the office next time.    Medication Instructions:    Continue with your current medicines.  We have sent in your refills today.    If you need a refill on your cardiac medications before your next appointment, please call your pharmacy.     Testing/Procedures To Be Arranged:  N/A  Follow-Up:   See me or Dr. Eden Emms in 3 to 4 months with fasting labs.     At [facility], you and your health needs are our priority.  As part of our continuing mission to provide you with exceptional heart care, we have created designated Provider Care Teams.  These Care Teams include your primary Cardiologist (physician) and Advanced Practice Providers (APPs -  Physician Assistants and Nurse Practitioners) who all work together to provide you with the care you need, when you need it.  Special Instructions:  . Stay safe, stay home, wash your hands for at least 20 seconds and wear a mask when out in public.  . It was good to talk with you today.    Call the [facility] office at [phone] if you have any questions, problems or concerns.

## 2019-11-11 ENCOUNTER — Other Ambulatory Visit: Payer: Self-pay | Admitting: Cardiovascular Disease

## 2019-12-06 NOTE — Progress Notes (Signed)
Date:  12/12/2019   ID:  KASHDEN DEBOY, DOB 16-Sep-1980, MRN [id number]   PCP:  Nicolette Bang, DO  Cardiologist: Johnsie Cancel Electrophysiologist:  None   History of Present Illness:    Alan Owens is a 39 y.o. male has a history of congenital heart disease. 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 Needs aid for feeding. Mother cares for him but known little history. Moved here from [place]. On Medicaid. On both HTN and HLD medicines.   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 Echo was updated 08/09/18  and EF was 45 to 50%. He was continued on medical management.   The patient does not have symptoms concerning for COVID-19 infection (fever, chills, cough, or new shortness of breath).   Mom and patient leary of getting vaccine Educated.  He has not had lab work in over a year   Some edema in LLE  Past Medical History:  Diagnosis Date   Acid reflux    Blind left eye    Congestive heart failure ([place])    Hypercholesterolemia    Hypertension    Juvenile glaucoma    Mental developmental delay    Renal failure    Vitamin D deficiency    Past Surgical History:  Procedure Laterality Date   MULTIPLE TOOTH EXTRACTIONS Bilateral 2009   all teeth extracted per patient's mother     Current Meds  Medication Sig   ALPHAGAN P 0.15 % ophthalmic solution INSTILL 1 DROP INTO EACH EYE TWICE A DAY   amLODipine (NORVASC) 2.5 MG tablet Take 1 tablet (2.5 mg total) by mouth daily.   ASPIRIN LOW DOSE 81 MG EC tablet TAKE 1 TABLET BY MOUTH EVERY DAY   atorvastatin (LIPITOR) 20 MG tablet Take 1 tablet (20 mg total) by mouth daily at 6 PM.   carvedilol (COREG) 6.25 MG tablet Take 1 tablet (6.25 mg total) by mouth 2 (two) times daily with a meal.   FLUoxetine (PROZAC) 20 MG capsule TAKE 1 CAPSULE BY MOUTH EVERY DAY   furosemide (LASIX) 20 MG tablet Take 1 tablet (20 mg total) by mouth  daily.   hydrOXYzine (ATARAX/VISTARIL) 50 MG tablet TAKE 1 TABLET (50 MG TOTAL) BY MOUTH 3 (THREE) TIMES DAILY AS NEEDED FOR ANXIETY OR ITCHING.   ipratropium (ATROVENT) 0.03 % nasal spray Place 2 sprays into both nostrils 2 (two) times daily.   latanoprost (XALATAN) 0.005 % ophthalmic solution Place 1 drop into both eyes at bedtime.   lisinopril (ZESTRIL) 10 MG tablet Take 1 tablet (10 mg total) by mouth daily.   omeprazole (PRILOSEC) 20 MG capsule Take 1 capsule (20 mg total) by mouth daily.   potassium chloride SA (KLOR-CON) 20 MEQ tablet Take 1 tablet (20 mEq total) by mouth daily.     Allergies:   Patient has no known allergies.   Social History   Tobacco Use   Smoking status: Never Smoker   Smokeless tobacco: Never Used  Substance Use Topics   Alcohol use: Never   Drug use: Never     Family Hx: The patient's family history includes Diabetes in his sibling; Hypertension in his sibling. There is no history of Colon cancer, Esophageal cancer, Stomach cancer, or Rectal cancer.  ROS:   Please see the history of present illness.   All other systems reviewed are negative.    Objective:  Vital Signs:  BP (!) 122/92    Pulse 91    Ht 5\' 7"  (1.702 m)    Wt 216 lb (98 kg)    SpO2 99%    BMI 33.83 kg/m    Wt Readings from Last 3 Encounters:  12/12/19 216 lb (98 kg)  09/12/19 196 lb (88.9 kg)  08/13/19 196 lb 12.8 oz (89.3 kg)    Overweight black male  Non verbal  HEENT: normal Neck supple with no adenopathy JVP normal no bruits no thyromegaly Lungs clear with no wheezing and good diaphragmatic motion Heart:  S1/S2 no murmur, no rub, gallop or click PMI increased  Abdomen: benighn, BS positve, no tenderness, no AAA no bruit.  No HSM or HJR Distal pulses intact with no bruits No edema Neuro non-focal Skin warm and dry No muscular weakness   Labs/Other Tests and Data Reviewed:    Lab Results  Component Value Date   WBC 4.7 03/14/2019   HGB 12.5 (L)  03/14/2019   HCT 37.4 (L) 03/14/2019   PLT 278 03/14/2019   GLUCOSE 113 (H) 03/14/2019   CHOL 109 07/13/2018   TRIG 83 07/13/2018   HDL 39 (L) 07/13/2018   LDLCALC 53 07/13/2018   ALT 19 07/13/2018   AST 20 07/13/2018   NA 139 03/14/2019   K 4.1 03/14/2019   CL 102 03/14/2019   CREATININE 1.17 03/14/2019   BUN 10 03/14/2019   CO2 21 03/14/2019   TSH 1.530 07/13/2018   HGBA1C 5.4 03/14/2019      Prior CV studies:    The following studies were reviewed today:   ECHO IMPRESSIONS 07/2018  1. The left ventricle appears to be normal in size, have normal wall  thickness, with mildly reduced systolic function of 45-50%. Echo evidence  of normal in diastolic filling patterns.  2. LV global hypokinesis.  3. The right ventricle is normal in size, has normal wall thickness and  normal systolic function.  4. Normal left atrial size.  5. Normal right atrial size.  6. The mitral valve normal in structure and function.  7. Normal tricuspid valve.  8. Aortic valve tricuspid.  9. Pulmonic valve regurgitation is mild by color flow Doppler.  10. The ascending aorta and aortic rootare normal is size and structure.  11. No atrial level shunt detected by color flow Doppler.    ECG:  SR rate 91 normal 12/12/19    ASSESSMENT & PLAN:     1. Dilated CM - doing well with current meds and low sodium diet EF 45-50% by echo January 2020  3 HTN - BP is ok - medicines refilled   4. HLD - on statin - needs lab work   5. COVID-19 Education: The signs and symptoms of COVID-19 were discussed with the patient and how to seek care for testing (follow up with PCP or arrange E-visit).  The importance of social distancing, staying at home, hand hygiene and wearing a mask when out in public were discussed today.   Medication Adjustments/Labs and Tests Ordered: Current medicines are reviewed at length with the patient today.  Concerns regarding medicines are outlined above.   Tests  Ordered: Routine lab work not done by primary   Medication Changes: No orders of the defined types were placed in this encounter.   Disposition:  FU with February 2020 in a year   Patient is agreeable to this plan and will call if any problems develop in the interim.   [place]  Eden Emms, MD  12/12/2019 11:02 AM    [facility]

## 2019-12-12 ENCOUNTER — Ambulatory Visit (INDEPENDENT_AMBULATORY_CARE_PROVIDER_SITE_OTHER): Payer: Medicaid Other | Admitting: Cardiovascular Disease

## 2019-12-12 ENCOUNTER — Other Ambulatory Visit: Payer: Self-pay

## 2019-12-12 ENCOUNTER — Telehealth: Payer: Self-pay

## 2019-12-12 ENCOUNTER — Encounter: Payer: Self-pay | Admitting: Cardiovascular Disease

## 2019-12-12 VITALS — BP 122/92 | HR 91 | Ht 67.0 in | Wt 216.0 lb

## 2019-12-12 DIAGNOSIS — I420 Dilated cardiomyopathy: Secondary | ICD-10-CM | POA: Diagnosis not present

## 2019-12-12 DIAGNOSIS — Z0000 Encounter for general adult medical examination without abnormal findings: Secondary | ICD-10-CM

## 2019-12-12 DIAGNOSIS — I10 Essential (primary) hypertension: Secondary | ICD-10-CM | POA: Diagnosis not present

## 2019-12-12 DIAGNOSIS — Z129 Encounter for screening for malignant neoplasm, site unspecified: Secondary | ICD-10-CM | POA: Diagnosis not present

## 2019-12-12 DIAGNOSIS — R899 Unspecified abnormal finding in specimens from other organs, systems and tissues: Secondary | ICD-10-CM

## 2019-12-12 NOTE — Telephone Encounter (Signed)
Will have patient redo lab work. Patient will come in tomorrow for BMET.

## 2019-12-12 NOTE — Telephone Encounter (Signed)
-----   Message from Wendall Stade, MD sent at 12/12/2019  4:59 PM EDT ----- Mild anemia labs othewise ok

## 2019-12-12 NOTE — Patient Instructions (Addendum)
Medication Instructions:  *If you need a refill on your cardiac medications before your next appointment, please call your pharmacy*  Lab Work: Your physician recommends that you have lab work today- CMET, CBC, HgbA1c, PSA, TSH  If you have labs (blood work) drawn today and your tests are completely normal, you will receive your results only by:  MyChart Message (if you have MyChart) OR  A paper copy in the mail If you have any lab test that is abnormal or we need to change your treatment, we will call you to review the results.  Testing/Procedures: None ordered today.  Follow-Up: At [facility], you and your health needs are our priority.  As part of our continuing mission to provide you with exceptional heart care, we have created designated Provider Care Teams.  These Care Teams include your primary Cardiologist (physician) and Advanced Practice Providers (APPs -  Physician Assistants and Nurse Practitioners) who all work together to provide you with the care you need, when you need it.  We recommend signing up for the patient portal called "MyChart".  Sign up information is provided on this After Visit Summary.  MyChart is used to connect with patients for Virtual Visits (Telemedicine).  Patients are able to view lab/test results, encounter notes, upcoming appointments, etc.  Non-urgent messages can be sent to your provider as well.   To learn more about what you can do with MyChart, go to [email or web address].    Your next appointment:   12 month(s)  The format for your next appointment:   In Person  Provider:   You may see Dr. Eden Emms or one of the following Advanced Practice Providers on your designated Care Team:    Norma Fredrickson, NP  Nada Boozer, NP  Georgie Chard, NP

## 2019-12-13 ENCOUNTER — Other Ambulatory Visit: Payer: Medicaid Other

## 2019-12-13 LAB — CBC WITH DIFFERENTIAL/PLATELET
Basophils Absolute: 0 10*3/uL (ref 0.0–0.2)
Basos: 0 %
EOS (ABSOLUTE): 0.1 10*3/uL (ref 0.0–0.4)
Eos: 2 %
Hematocrit: 36.8 % — ABNORMAL LOW (ref 37.5–51.0)
Hemoglobin: 12.6 g/dL — ABNORMAL LOW (ref 13.0–17.7)
Immature Grans (Abs): 0 10*3/uL (ref 0.0–0.1)
Immature Granulocytes: 0 %
Lymphocytes Absolute: 1.8 10*3/uL (ref 0.7–3.1)
Lymphs: 31 %
MCH: 31.3 pg (ref 26.6–33.0)
MCHC: 34.2 g/dL (ref 31.5–35.7)
MCV: 91 fL (ref 79–97)
Monocytes Absolute: 0.5 10*3/uL (ref 0.1–0.9)
Monocytes: 9 %
Neutrophils Absolute: 3.2 10*3/uL (ref 1.4–7.0)
Neutrophils: 58 %
Platelets: 270 10*3/uL (ref 150–450)
RBC: 4.03 x10E6/uL — ABNORMAL LOW (ref 4.14–5.80)
RDW: 13.5 % (ref 11.6–15.4)
WBC: 5.6 10*3/uL (ref 3.4–10.8)

## 2019-12-13 LAB — COMPREHENSIVE METABOLIC PANEL
ALT: 21 IU/L (ref 0–44)
AST: 18 IU/L (ref 0–40)
Albumin/Globulin Ratio: 1.6 (ref 1.2–2.2)
Albumin: 4.8 g/dL (ref 4.0–5.0)
Alkaline Phosphatase: 124 IU/L — ABNORMAL HIGH (ref 48–121)
BUN/Creatinine Ratio: 10 (ref 9–20)
BUN: 13 mg/dL (ref 6–24)
Bilirubin Total: 0.5 mg/dL (ref 0.0–1.2)
CO2: 25 mmol/L (ref 20–29)
Calcium: 10.3 mg/dL — ABNORMAL HIGH (ref 8.7–10.2)
Chloride: 5 mmol/L — CL (ref 96–106)
Creatinine, Ser: 1.24 mg/dL (ref 0.76–1.27)
GFR calc Af Amer: 83 mL/min/{1.73_m2} (ref >59)
GFR calc non Af Amer: 72 mL/min/{1.73_m2} (ref >59)
Globulin, Total: 3 g/dL (ref 1.5–4.5)
Glucose: 109 mg/dL — ABNORMAL HIGH (ref 65–99)
Potassium: 4.1 mmol/L (ref 3.5–5.2)
Sodium: 141 mmol/L (ref 134–144)
Total Protein: 7.8 g/dL (ref 6.0–8.5)

## 2019-12-13 LAB — HEMOGLOBIN A1C
Est. average glucose Bld gHb Est-mCnc: 120 mg/dL
Hgb A1c MFr Bld: 5.8 % — ABNORMAL HIGH (ref 4.8–5.6)

## 2019-12-13 LAB — PSA: Prostate Specific Ag, Serum: 0.6 ng/mL (ref 0.0–4.0)

## 2019-12-13 LAB — TSH: TSH: 2.03 u[IU]/mL (ref 0.450–4.500)

## 2019-12-16 ENCOUNTER — Other Ambulatory Visit: Payer: Self-pay | Admitting: Family Medicine

## 2019-12-20 ENCOUNTER — Other Ambulatory Visit: Payer: Self-pay | Admitting: Family Medicine

## 2019-12-20 DIAGNOSIS — F424 Excoriation (skin-picking) disorder: Secondary | ICD-10-CM

## 2020-02-08 ENCOUNTER — Other Ambulatory Visit: Payer: Self-pay

## 2020-02-08 MED ORDER — ATORVASTATIN CALCIUM 20 MG PO TABS: 20.0000 mg | ORAL_TABLET | Freq: Every day | ORAL | 3 refills | Status: DC

## 2020-02-08 NOTE — Telephone Encounter (Signed)
Patient requested refills for atorvastatin. Sent refills to patient's pharmacy of choice.

## 2020-02-11 ENCOUNTER — Ambulatory Visit: Payer: Medicaid Other | Admitting: Internal Medicine

## 2020-06-25 ENCOUNTER — Encounter: Payer: Self-pay | Admitting: Internal Medicine

## 2020-06-25 ENCOUNTER — Other Ambulatory Visit: Payer: Self-pay

## 2020-06-25 ENCOUNTER — Ambulatory Visit (INDEPENDENT_AMBULATORY_CARE_PROVIDER_SITE_OTHER): Payer: Medicaid Other | Admitting: Internal Medicine

## 2020-06-25 VITALS — BP 129/85 | HR 80 | Temp 97.5°F | Resp 17 | Wt 222.0 lb

## 2020-06-25 DIAGNOSIS — R635 Abnormal weight gain: Secondary | ICD-10-CM | POA: Diagnosis not present

## 2020-06-25 DIAGNOSIS — I509 Heart failure, unspecified: Secondary | ICD-10-CM | POA: Diagnosis not present

## 2020-06-25 DIAGNOSIS — Z0289 Encounter for other administrative examinations: Secondary | ICD-10-CM

## 2020-06-25 DIAGNOSIS — K219 Gastro-esophageal reflux disease without esophagitis: Secondary | ICD-10-CM | POA: Diagnosis not present

## 2020-06-25 MED ORDER — OMEPRAZOLE 40 MG PO CPDR: 40.0000 mg | DELAYED_RELEASE_CAPSULE | Freq: Every day | ORAL | 3 refills | Status: DC

## 2020-06-25 MED ORDER — FUROSEMIDE 20 MG PO TABS: 20.0000 mg | ORAL_TABLET | Freq: Every day | ORAL | 3 refills | Status: DC

## 2020-06-25 NOTE — Progress Notes (Signed)
Subjective:    Alan Owens - 39 y.o. male MRN [id number]  Date of birth: [birth year]  HPI  [name] is here for concerns about unexplained weight gain. Patient presents with his mother who is his care taker. She reports no recent changes in diet or change in lifestyle (I.e. no increase in sedentary lifestyle). Mother is concerned this could be related to his lack of Lasix for the past week or so. No chest pain, SOB, DOE, PND, cough.   Weight Trend:  196 lbs on 3/3  216 lbs on 6/2 222 lbs on 12/15    Health Maintenance:  Health Maintenance Due  Topic Date Due  . Hepatitis C Screening  Never done    -  reports that he has never smoked. He has never used smokeless tobacco. - Review of Systems: Per HPI. - Past Medical History: Patient Active Problem List   Diagnosis Date Noted  . Glaucoma 09/10/2018  . Congestive heart failure (HCC) 09/10/2018  . Dysphagia 09/10/2018  . Mixed hyperlipidemia 09/10/2018  . Cognitive impairment 09/10/2018  . Skin picking habit 09/10/2018   - Medications: reviewed and updated   Objective:   Physical Exam BP 129/85   Pulse 80   Temp (!) 97.5 F (36.4 C) (Temporal)   Resp 17   Wt 222 lb (100.7 kg)   SpO2 97%   BMI 34.77 kg/m  Physical Exam Constitutional:      General: He is not in acute distress.    Appearance: He is not diaphoretic.  HENT:     Head: Normocephalic and atraumatic.  Eyes:     Extraocular Movements: EOM normal.     Conjunctiva/sclera: Conjunctivae normal.  Cardiovascular:     Rate and Rhythm: Normal rate and regular rhythm.     Heart sounds: Normal heart sounds. No murmur heard.   Pulmonary:     Effort: Pulmonary effort is normal. No respiratory distress.     Breath sounds: Normal breath sounds.  Musculoskeletal:        General: No swelling. Normal range of motion.  Skin:    General: Skin is warm and dry.  Neurological:     Mental Status: He is alert and oriented to person, place, and time.   Psychiatric:        Mood and Affect: Affect normal.        Judgment: Judgment normal.            Assessment & Plan:   1. Unexplained weight gain Patient has had slow increase in weight gain this past year. Highly doubt related to recent non-compliance with Lasix given slow uptrend and given no signs of overt volume overload on exam. Recent TSH in June was within normal limits. A1c has been stable with mild pre-diabetes. Reviewed medications and beta-blocker could be potential offender; however, patient has been on same dose for several years so less likely. No other concerning symptoms. Discussed diet and exercise. Will continue to montior.   2. Congestive heart failure, unspecified HF chronicity, unspecified heart failure type (HCC) Volume status is stable. Resume Lasix.  - furosemide (LASIX) 20 MG tablet; Take 1 tablet (20 mg total) by mouth daily.  Dispense: 90 tablet; Refill: 3  3. Gastroesophageal reflux disease, unspecified whether esophagitis present - omeprazole (PRILOSEC) 40 MG capsule; Take 1 capsule (40 mg total) by mouth daily.  Dispense: 30 capsule; Refill: 3 - Ambulatory referral to Gastroenterology  4. Encounter for completion of form with patient Form completed for adult  daycare services.       Marcy Siren, D.O. 07/01/2020, 10:42 AM Primary Care at [facility]

## 2020-06-26 ENCOUNTER — Other Ambulatory Visit: Payer: Self-pay | Admitting: Internal Medicine

## 2020-07-15 ENCOUNTER — Inpatient Hospital Stay (HOSPITAL_COMMUNITY)
Admission: EM | Admit: 2020-07-15 | Discharge: 2020-07-21 | DRG: 177 | Disposition: A | Discharge status: Home / Self Care | Payer: Medicaid Other | Attending: Internal Medicine | Admitting: Internal Medicine

## 2020-07-15 ENCOUNTER — Encounter (HOSPITAL_COMMUNITY): Payer: Self-pay

## 2020-07-15 ENCOUNTER — Emergency Department (HOSPITAL_COMMUNITY): Payer: Medicaid Other

## 2020-07-15 ENCOUNTER — Inpatient Hospital Stay (HOSPITAL_COMMUNITY): Payer: Medicaid Other

## 2020-07-15 ENCOUNTER — Other Ambulatory Visit: Payer: Self-pay

## 2020-07-15 DIAGNOSIS — K219 Gastro-esophageal reflux disease without esophagitis: Secondary | ICD-10-CM | POA: Diagnosis present

## 2020-07-15 DIAGNOSIS — B964 Proteus (mirabilis) (morganii) as the cause of diseases classified elsewhere: Secondary | ICD-10-CM | POA: Diagnosis present

## 2020-07-15 DIAGNOSIS — Z79899 Other long term (current) drug therapy: Secondary | ICD-10-CM | POA: Diagnosis not present

## 2020-07-15 DIAGNOSIS — H5462 Unqualified visual loss, left eye, normal vision right eye: Secondary | ICD-10-CM | POA: Diagnosis present

## 2020-07-15 DIAGNOSIS — G253 Myoclonus: Secondary | ICD-10-CM | POA: Diagnosis present

## 2020-07-15 DIAGNOSIS — I428 Other cardiomyopathies: Secondary | ICD-10-CM | POA: Diagnosis present

## 2020-07-15 DIAGNOSIS — Z7982 Long term (current) use of aspirin: Secondary | ICD-10-CM | POA: Diagnosis not present

## 2020-07-15 DIAGNOSIS — D649 Anemia, unspecified: Secondary | ICD-10-CM | POA: Diagnosis present

## 2020-07-15 DIAGNOSIS — E876 Hypokalemia: Secondary | ICD-10-CM | POA: Diagnosis present

## 2020-07-15 DIAGNOSIS — N390 Urinary tract infection, site not specified: Secondary | ICD-10-CM | POA: Diagnosis present

## 2020-07-15 DIAGNOSIS — R4189 Other symptoms and signs involving cognitive functions and awareness: Secondary | ICD-10-CM | POA: Diagnosis present

## 2020-07-15 DIAGNOSIS — I5022 Chronic systolic (congestive) heart failure: Secondary | ICD-10-CM | POA: Diagnosis present

## 2020-07-15 DIAGNOSIS — E7800 Pure hypercholesterolemia, unspecified: Secondary | ICD-10-CM | POA: Diagnosis present

## 2020-07-15 DIAGNOSIS — Q150 Congenital glaucoma: Secondary | ICD-10-CM | POA: Diagnosis not present

## 2020-07-15 DIAGNOSIS — Z8249 Family history of ischemic heart disease and other diseases of the circulatory system: Secondary | ICD-10-CM | POA: Diagnosis not present

## 2020-07-15 DIAGNOSIS — J1282 Pneumonia due to coronavirus disease 2019: Secondary | ICD-10-CM | POA: Diagnosis present

## 2020-07-15 DIAGNOSIS — I509 Heart failure, unspecified: Secondary | ICD-10-CM

## 2020-07-15 DIAGNOSIS — N179 Acute kidney failure, unspecified: Secondary | ICD-10-CM | POA: Diagnosis present

## 2020-07-15 DIAGNOSIS — I10 Essential (primary) hypertension: Secondary | ICD-10-CM | POA: Diagnosis not present

## 2020-07-15 DIAGNOSIS — I110 Hypertensive heart disease with heart failure: Secondary | ICD-10-CM | POA: Diagnosis present

## 2020-07-15 DIAGNOSIS — U071 COVID-19: Secondary | ICD-10-CM | POA: Diagnosis present

## 2020-07-15 LAB — CBC WITH DIFFERENTIAL/PLATELET
Abs Immature Granulocytes: 0 10*3/uL (ref 0.00–0.07)
Basophils Absolute: 0 10*3/uL (ref 0.0–0.1)
Basophils Relative: 0 %
Eosinophils Absolute: 0 10*3/uL (ref 0.0–0.5)
Eosinophils Relative: 0 %
HCT: 32.6 % — ABNORMAL LOW (ref 39.0–52.0)
Hemoglobin: 11.3 g/dL — ABNORMAL LOW (ref 13.0–17.0)
Immature Granulocytes: 0 %
Lymphocytes Relative: 38 %
Lymphs Abs: 1.6 10*3/uL (ref 0.7–4.0)
MCH: 31.4 pg (ref 26.0–34.0)
MCHC: 34.7 g/dL (ref 30.0–36.0)
MCV: 90.6 fL (ref 80.0–100.0)
Monocytes Absolute: 0.4 10*3/uL (ref 0.1–1.0)
Monocytes Relative: 9 %
Neutro Abs: 2.2 10*3/uL (ref 1.7–7.7)
Neutrophils Relative %: 53 %
Platelets: 166 10*3/uL (ref 150–400)
RBC: 3.6 MIL/uL — ABNORMAL LOW (ref 4.22–5.81)
RDW: 14.3 % (ref 11.5–15.5)
WBC: 4.2 10*3/uL (ref 4.0–10.5)
nRBC: 0 % (ref 0.0–0.2)

## 2020-07-15 LAB — COMPREHENSIVE METABOLIC PANEL
ALT: 22 U/L (ref 0–44)
AST: 30 U/L (ref 15–41)
Albumin: 3.8 g/dL (ref 3.5–5.0)
Alkaline Phosphatase: 79 U/L (ref 38–126)
Anion gap: 11 (ref 5–15)
BUN: 40 mg/dL — ABNORMAL HIGH (ref 6–20)
CO2: 21 mmol/L — ABNORMAL LOW (ref 22–32)
Calcium: 8.6 mg/dL — ABNORMAL LOW (ref 8.9–10.3)
Chloride: 104 mmol/L (ref 98–111)
Creatinine, Ser: 4.02 mg/dL — ABNORMAL HIGH (ref 0.61–1.24)
GFR, Estimated: 18 mL/min — ABNORMAL LOW (ref >60)
Glucose, Bld: 103 mg/dL — ABNORMAL HIGH (ref 70–99)
Potassium: 3.6 mmol/L (ref 3.5–5.1)
Sodium: 136 mmol/L (ref 135–145)
Total Bilirubin: 0.9 mg/dL (ref 0.3–1.2)
Total Protein: 7.7 g/dL (ref 6.5–8.1)

## 2020-07-15 LAB — POC SARS CORONAVIRUS 2 AG -  ED: SARS Coronavirus 2 Ag: POSITIVE — AB

## 2020-07-15 LAB — LIPASE, BLOOD: Lipase: 38 U/L (ref 11–51)

## 2020-07-15 LAB — LACTIC ACID, PLASMA: Lactic Acid, Venous: 0.6 mmol/L (ref 0.5–1.9)

## 2020-07-15 MED ORDER — ONDANSETRON HCL 4 MG/2ML IJ SOLN
4.0000 mg | Freq: Once | INTRAMUSCULAR | Status: AC
  Administered 2020-07-15: 4 mg via INTRAVENOUS
  Filled 2020-07-15: qty 2

## 2020-07-15 MED ORDER — ONDANSETRON HCL 4 MG/2ML IJ SOLN
INTRAMUSCULAR | Status: AC
  Filled 2020-07-15: qty 2

## 2020-07-15 MED ORDER — ACETAMINOPHEN 325 MG PO TABS
650.0000 mg | ORAL_TABLET | Freq: Once | ORAL | Status: AC
  Administered 2020-07-15: 650 mg via ORAL
  Filled 2020-07-15: qty 2

## 2020-07-15 MED ORDER — SODIUM CHLORIDE 0.9 % IV BOLUS
500.0000 mL | Freq: Once | INTRAVENOUS | Status: AC
  Administered 2020-07-15: 500 mL via INTRAVENOUS

## 2020-07-15 MED ORDER — BENZONATATE 100 MG PO CAPS
100.0000 mg | ORAL_CAPSULE | Freq: Once | ORAL | Status: AC
  Administered 2020-07-15: 100 mg via ORAL
  Filled 2020-07-15: qty 1

## 2020-07-15 MED ORDER — SODIUM CHLORIDE 0.9 % IV SOLN: Freq: Once | INTRAVENOUS | Status: AC

## 2020-07-15 NOTE — ED Provider Notes (Signed)
[facility]-EMERGENCY DEPT Provider Note   CSN: [id number] Arrival date & time: 07/15/20  1934     History No chief complaint on file.   Alan Owens is a 40 y.o. male with past medical history of cognitive impairment, CHF, hypertension, brought into the ED by mother for poor p.o. intake which she initially reported occurred today, however it appears has been a couple of days.  She states he has had intermittent cough which she noticed yesterday.  She states this morning he had one episode of emesis.  He has been refusing food or drink all day.  He has not had Covid vaccines.  She has not noted any fevers though he told her he was cold today.  He has not been complaining of any pain.  Level 5 caveat, patient is unable to provide history secondary to intellectual delay.  The history is provided by a parent. The history is limited by a developmental delay.       Past Medical History:  Diagnosis Date  . Acid reflux   . Blind left eye   . Congestive heart failure (HCC)   . Hypercholesterolemia   . Hypertension   . Juvenile glaucoma   . Mental developmental delay   . Renal failure   . Vitamin D deficiency     Patient Active Problem List   Diagnosis Date Noted  . ARF (acute renal failure) (HCC) 07/15/2020  . Glaucoma 09/10/2018  . Congestive heart failure (HCC) 09/10/2018  . Dysphagia 09/10/2018  . Mixed hyperlipidemia 09/10/2018  . Cognitive impairment 09/10/2018  . Skin picking habit 09/10/2018    Past Surgical History:  Procedure Laterality Date  . MULTIPLE TOOTH EXTRACTIONS Bilateral 2009   all teeth extracted per patient's mother       Family History  Problem Relation Age of Onset  . Diabetes Sibling   . Hypertension Sibling   . Colon cancer Neg Hx   . Esophageal cancer Neg Hx   . Stomach cancer Neg Hx   . Rectal cancer Neg Hx     Social History   Tobacco Use  . Smoking status: Never Smoker  . Smokeless tobacco: Never Used   Vaping Use  . Vaping Use: Never used  Substance Use Topics  . Alcohol use: Never  . Drug use: Never    Home Medications Prior to Admission medications   Medication Sig Start Date End Date Taking? Authorizing Provider  ALPHAGAN P 0.1 % SOLN Apply 1 drop to eye in the morning and at bedtime. 05/24/20  Yes [provider]  amLODipine (NORVASC) 2.5 MG tablet Take 1 tablet (2.5 mg total) by mouth daily. 09/12/19  Yes Rosalio Macadamia, NP  ASPIRIN LOW DOSE 81 MG EC tablet TAKE 1 TABLET BY MOUTH EVERY DAY Patient taking differently: Take 81 mg by mouth daily. 12/10/18  Yes Bing Neighbors, FNP  atorvastatin (LIPITOR) 20 MG tablet Take 1 tablet (20 mg total) by mouth daily at 6 PM. 02/08/20  Yes Wendall Stade, MD  carvedilol (COREG) 6.25 MG tablet Take 1 tablet (6.25 mg total) by mouth 2 (two) times daily with a meal. 09/12/19  Yes Rosalio Macadamia, NP  furosemide (LASIX) 20 MG tablet Take 1 tablet (20 mg total) by mouth daily. 06/25/20  Yes Arvilla Market, DO  hydrOXYzine (ATARAX/VISTARIL) 50 MG tablet TAKE 1 TABLET BY MOUTH AT NIGHT Patient taking differently: Take 50 mg by mouth at bedtime. 06/26/20  Yes Arvilla Market, DO  latanoprost (XALATAN) 0.005 % ophthalmic solution Place 1 drop into both eyes at bedtime. 03/14/19  Yes Claiborne Rigg, NP  lisinopril (ZESTRIL) 10 MG tablet Take 1 tablet (10 mg total) by mouth daily. 09/12/19  Yes Rosalio Macadamia, NP  omeprazole (PRILOSEC) 40 MG capsule Take 1 capsule (40 mg total) by mouth daily. 06/25/20  Yes Arvilla Market, DO  potassium chloride SA (KLOR-CON) 20 MEQ tablet Take 1 tablet (20 mEq total) by mouth daily. 09/12/19  Yes Rosalio Macadamia, NP    Allergies    Patient has no known allergies.  Review of Systems   Review of Systems  Reason unable to perform ROS: developmental delay.    Physical Exam Updated Vital Signs BP 116/83 (BP Location: Right Arm)   Pulse 99   Temp (!) 101.6 F (38.7 C)  (Oral)   Resp (!) 22   Ht 5\' 7"  (1.702 m)   Wt 102.1 kg   SpO2 97%   BMI 35.24 kg/m   Physical Exam Vitals and nursing note reviewed.  Constitutional:      General: He is not in acute distress.    Appearance: He is well-developed and well-nourished.  HENT:     Head: Normocephalic and atraumatic.     Mouth/Throat:     Mouth: Mucous membranes are moist.  Eyes:     Conjunctiva/sclera: Conjunctivae normal.  Cardiovascular:     Rate and Rhythm: Regular rhythm. Tachycardia present.  Pulmonary:     Effort: Pulmonary effort is normal. No respiratory distress.     Comments: Unable to adequately assess lung sounds as patient is unable to follow commands with deep breathing. No obvious abnl lung sounds. Abdominal:     General: Bowel sounds are normal. There is distension.     Palpations: Abdomen is soft.     Tenderness: There is no abdominal tenderness. There is no guarding or rebound.  Musculoskeletal:     Right lower leg: No edema.     Left lower leg: No edema.  Skin:    General: Skin is warm.  Neurological:     Mental Status: He is alert.  Psychiatric:        Mood and Affect: Mood and affect normal.        Behavior: Behavior normal.     ED Results / Procedures / Treatments   Labs (all labs ordered are listed, but only abnormal results are displayed) Labs Reviewed  CBC WITH DIFFERENTIAL/PLATELET - Abnormal; Notable for the following components:      Result Value   RBC 3.60 (*)    Hemoglobin 11.3 (*)    HCT 32.6 (*)    All other components within normal limits  COMPREHENSIVE METABOLIC PANEL - Abnormal; Notable for the following components:   CO2 21 (*)    Glucose, Bld 103 (*)    BUN 40 (*)    Creatinine, Ser 4.02 (*)    Calcium 8.6 (*)    GFR, Estimated 18 (*)    All other components within normal limits  POC SARS CORONAVIRUS 2 AG -  ED - Abnormal; Notable for the following components:   SARS Coronavirus 2 Ag POSITIVE (*)    All other components within normal limits   LIPASE, BLOOD  LACTIC ACID, PLASMA  URINALYSIS, ROUTINE W REFLEX MICROSCOPIC  LACTIC ACID, PLASMA    EKG None  Radiology DG Chest Port 1 View  Result Date: 07/15/2020 CLINICAL DATA:  Nausea and vomiting with fever and cough. EXAM: PORTABLE  CHEST 1 VIEW COMPARISON:  September 17, 2003 FINDINGS: Mildly increased bronchovascular lung markings are seen within the bilateral lung bases. There is no evidence of a pleural effusion or pneumothorax. The cardiac silhouette is mildly enlarged and unchanged in size. Stable scoliosis is seen involving the thoracic spine without acute osseous abnormality. IMPRESSION: Mildly increased bibasilar bronchovascular lung markings which may represent early infiltrates. Correlation with follow-up chest plain film is recommended. Electronically Signed   By: Virgina Norfolk M.D.   On: 07/15/2020 20:57    Procedures Procedures (including critical care time)  Medications Ordered in ED Medications  0.9 %  sodium chloride infusion (has no administration in time range)  benzonatate (TESSALON) capsule 100 mg (has no administration in time range)  sodium chloride 0.9 % bolus 500 mL (has no administration in time range)  ondansetron (ZOFRAN) injection 4 mg (4 mg Intravenous Given 07/15/20 2104)  acetaminophen (TYLENOL) tablet 650 mg (650 mg Oral Given 07/15/20 2103)    ED Course  I have reviewed the triage vital signs and the nursing notes.  Pertinent labs & imaging results that were available during my care of the patient were reviewed by me and considered in my medical decision making (see chart for details).  Clinical Course as of 07/15/20 2245  Tue Jul 15, 2020  2146 Discussed acute kidney injury and positive Covid test.  Patient's mother states that it has been a few days that he has not been drinking.  However this is [place] [JR]    Clinical Course User Index [JR] Armstrong Creasy, [place] N, PA-C   MDM Rules/Calculators/A&P                          Patient is  brought in by mother for couple of days of poor p.o. intake, chills today, cough began last night.  He is noted to be febrile on arrival, no acute respiratory distress, satting well on room air.  Patient was unable provide any history secondary to developmental delay.  Abdomen appears slightly distended though is nontender.  Laboratory work-up initiated, Covid swab, UA, chest x-ray to evaluate for potential source of infection.  Fever treated with Tylenol.  IV fluids held initially due to history of CHF.  Labs reveal positive POC Covid antigen test.  White count is 4.2.  Hemoglobin slightly low 11.3.  Metabolic panel with acutely elevated creatinine of 4, BUN of 40.   This is up from 7 months ago when it was within normal limits.  Potassium is 3.6. Bladder scan done by NT while I was present, bladder scan malfunctioning and not giving official read though U/S images visualized without obvious evidence of over distension or large volume- small to moderate amount of urine noted in the bladder. In and out cath ordered for U/A. IVF ordered, small bolus and infusion.  Patient will require admission for acute kidney failure in the setting of COVID-19 illness.  Consider dehydration vs kidney injury assoc with COVID 19 infection vs obstructive nephropathy. Discussed with hospitalist.  CT stone study ordered.   Final Clinical Impression(s) / ED Diagnoses Final diagnoses:  Acute renal failure, unspecified acute renal failure type [facility])  COVID-19    Rx / DC Orders ED Discharge Orders    None       Graziella Connery, [place] N, PA-C 07/15/20 2245    Charlesetta Shanks, MD 07/18/20 2117

## 2020-07-15 NOTE — ED Notes (Signed)
Ambulated pt around the room oxygen saturation remained above 93% while ambulating.

## 2020-07-15 NOTE — ED Triage Notes (Signed)
Patient BIB [facility] from home for nausea/vomting. EMS reports that pt has 1 vomiting episode today per patient's mother. Patient has IDD and is speaks incomprehensible words. Mother reports no urination today

## 2020-07-15 NOTE — ED Notes (Signed)
Patient has IDD and mother states this patient is at baseline. Patient normally "babbles" and mom states this is usual.

## 2020-07-16 ENCOUNTER — Inpatient Hospital Stay (HOSPITAL_COMMUNITY): Payer: Medicaid Other

## 2020-07-16 ENCOUNTER — Encounter: Payer: Medicaid Other | Admitting: Family

## 2020-07-16 ENCOUNTER — Encounter (HOSPITAL_COMMUNITY): Payer: Self-pay | Admitting: Internal Medicine

## 2020-07-16 DIAGNOSIS — N179 Acute kidney failure, unspecified: Secondary | ICD-10-CM | POA: Diagnosis not present

## 2020-07-16 DIAGNOSIS — J1282 Pneumonia due to coronavirus disease 2019: Secondary | ICD-10-CM | POA: Diagnosis not present

## 2020-07-16 DIAGNOSIS — U071 COVID-19: Principal | ICD-10-CM

## 2020-07-16 DIAGNOSIS — R4189 Other symptoms and signs involving cognitive functions and awareness: Secondary | ICD-10-CM

## 2020-07-16 LAB — C-REACTIVE PROTEIN: CRP: 0.7 mg/dL (ref <1.0)

## 2020-07-16 LAB — URINALYSIS, ROUTINE W REFLEX MICROSCOPIC
Bilirubin Urine: NEGATIVE
Glucose, UA: NEGATIVE mg/dL
Hgb urine dipstick: NEGATIVE
Ketones, ur: NEGATIVE mg/dL
Nitrite: NEGATIVE
Protein, ur: >=300 mg/dL — AB
Specific Gravity, Urine: 1.02 (ref 1.005–1.030)
WBC, UA: >50 WBC/hpf — ABNORMAL HIGH (ref 0–5)
pH: 7 (ref 5.0–8.0)

## 2020-07-16 LAB — CBC
HCT: 31.3 % — ABNORMAL LOW (ref 39.0–52.0)
Hemoglobin: 10.6 g/dL — ABNORMAL LOW (ref 13.0–17.0)
MCH: 31 pg (ref 26.0–34.0)
MCHC: 33.9 g/dL (ref 30.0–36.0)
MCV: 91.5 fL (ref 80.0–100.0)
Platelets: 167 10*3/uL (ref 150–400)
RBC: 3.42 MIL/uL — ABNORMAL LOW (ref 4.22–5.81)
RDW: 14.5 % (ref 11.5–15.5)
WBC: 4.5 10*3/uL (ref 4.0–10.5)
nRBC: 0 % (ref 0.0–0.2)

## 2020-07-16 LAB — CREATININE, SERUM
Creatinine, Ser: 3.95 mg/dL — ABNORMAL HIGH (ref 0.61–1.24)
GFR, Estimated: 19 mL/min — ABNORMAL LOW (ref >60)

## 2020-07-16 LAB — HIV ANTIBODY (ROUTINE TESTING W REFLEX): HIV Screen 4th Generation wRfx: NONREACTIVE

## 2020-07-16 LAB — D-DIMER, QUANTITATIVE: D-Dimer, Quant: 2.64 ug/mL-FEU — ABNORMAL HIGH (ref 0.00–0.50)

## 2020-07-16 LAB — CREATININE, URINE, RANDOM: Creatinine, Urine: 321.85 mg/dL

## 2020-07-16 LAB — CK: Total CK: 845 U/L — ABNORMAL HIGH (ref 49–397)

## 2020-07-16 LAB — PROCALCITONIN: Procalcitonin: 0.3 ng/mL

## 2020-07-16 LAB — TROPONIN I (HIGH SENSITIVITY)
Troponin I (High Sensitivity): 16 ng/L (ref <18)
Troponin I (High Sensitivity): 18 ng/L — ABNORMAL HIGH (ref <18)

## 2020-07-16 LAB — LACTIC ACID, PLASMA: Lactic Acid, Venous: 0.7 mmol/L (ref 0.5–1.9)

## 2020-07-16 LAB — SODIUM, URINE, RANDOM: Sodium, Ur: 46 mmol/L

## 2020-07-16 MED ORDER — BRIMONIDINE TARTRATE 0.15 % OP SOLN
1.0000 [drp] | Freq: Three times a day (TID) | OPHTHALMIC | Status: DC
  Administered 2020-07-16 – 2020-07-21 (×15): 1 [drp] via OPHTHALMIC
  Filled 2020-07-16: qty 5

## 2020-07-16 MED ORDER — ACETAMINOPHEN 650 MG RE SUPP: 650.0000 mg | Freq: Four times a day (QID) | RECTAL | Status: DC | PRN

## 2020-07-16 MED ORDER — AMLODIPINE BESYLATE 5 MG PO TABS
2.5000 mg | ORAL_TABLET | Freq: Every day | ORAL | Status: DC
  Administered 2020-07-16 – 2020-07-21 (×6): 2.5 mg via ORAL
  Filled 2020-07-16 (×6): qty 1

## 2020-07-16 MED ORDER — LATANOPROST 0.005 % OP SOLN
1.0000 [drp] | Freq: Every day | OPHTHALMIC | Status: DC
  Administered 2020-07-17 – 2020-07-20 (×5): 1 [drp] via OPHTHALMIC
  Filled 2020-07-16: qty 2.5

## 2020-07-16 MED ORDER — ZINC SULFATE 220 (50 ZN) MG PO CAPS
220.0000 mg | ORAL_CAPSULE | Freq: Every day | ORAL | Status: DC
  Administered 2020-07-16 – 2020-07-21 (×6): 220 mg via ORAL
  Filled 2020-07-16 (×6): qty 1

## 2020-07-16 MED ORDER — HYDROCOD POLST-CPM POLST ER 10-8 MG/5ML PO SUER
5.0000 mL | Freq: Two times a day (BID) | ORAL | Status: DC | PRN
  Administered 2020-07-16 – 2020-07-19 (×2): 5 mL via ORAL
  Filled 2020-07-16 (×2): qty 5

## 2020-07-16 MED ORDER — ATORVASTATIN CALCIUM 20 MG PO TABS
20.0000 mg | ORAL_TABLET | Freq: Every day | ORAL | Status: DC
  Administered 2020-07-16 – 2020-07-20 (×5): 20 mg via ORAL
  Filled 2020-07-16: qty 2
  Filled 2020-07-16 (×4): qty 1

## 2020-07-16 MED ORDER — SODIUM CHLORIDE 0.9 % IV SOLN
100.0000 mg | Freq: Every day | INTRAVENOUS | Status: AC
  Administered 2020-07-17 – 2020-07-20 (×4): 100 mg via INTRAVENOUS
  Filled 2020-07-16 (×4): qty 20

## 2020-07-16 MED ORDER — SODIUM CHLORIDE 0.9 % IV SOLN: INTRAVENOUS | Status: DC

## 2020-07-16 MED ORDER — PANTOPRAZOLE SODIUM 40 MG PO TBEC
40.0000 mg | DELAYED_RELEASE_TABLET | Freq: Every day | ORAL | Status: DC
  Administered 2020-07-16 – 2020-07-21 (×6): 40 mg via ORAL
  Filled 2020-07-16 (×6): qty 1

## 2020-07-16 MED ORDER — HYDRALAZINE HCL 20 MG/ML IJ SOLN: 10.0000 mg | INTRAMUSCULAR | Status: DC | PRN

## 2020-07-16 MED ORDER — HEPARIN SODIUM (PORCINE) 5000 UNIT/ML IJ SOLN
5000.0000 [IU] | Freq: Three times a day (TID) | INTRAMUSCULAR | Status: DC
  Administered 2020-07-16 – 2020-07-19 (×11): 5000 [IU] via SUBCUTANEOUS
  Filled 2020-07-16 (×11): qty 1

## 2020-07-16 MED ORDER — GUAIFENESIN-DM 100-10 MG/5ML PO SYRP: 10.0000 mL | ORAL_SOLUTION | ORAL | Status: DC | PRN

## 2020-07-16 MED ORDER — CARVEDILOL 6.25 MG PO TABS
6.2500 mg | ORAL_TABLET | Freq: Two times a day (BID) | ORAL | Status: DC
  Administered 2020-07-16 – 2020-07-21 (×11): 6.25 mg via ORAL
  Filled 2020-07-16 (×5): qty 1
  Filled 2020-07-16: qty 2
  Filled 2020-07-16: qty 1
  Filled 2020-07-16: qty 2
  Filled 2020-07-16 (×2): qty 1

## 2020-07-16 MED ORDER — HYDROXYZINE HCL 25 MG PO TABS
50.0000 mg | ORAL_TABLET | Freq: Every day | ORAL | Status: DC
  Administered 2020-07-17 – 2020-07-20 (×4): 50 mg via ORAL
  Filled 2020-07-16 (×5): qty 2

## 2020-07-16 MED ORDER — IPRATROPIUM-ALBUTEROL 20-100 MCG/ACT IN AERS
2.0000 | INHALATION_SPRAY | Freq: Four times a day (QID) | RESPIRATORY_TRACT | Status: DC
  Administered 2020-07-16 – 2020-07-17 (×6): 2 via RESPIRATORY_TRACT
  Filled 2020-07-16: qty 4

## 2020-07-16 MED ORDER — METHYLPREDNISOLONE SODIUM SUCC 125 MG IJ SOLR
62.5000 mg | Freq: Two times a day (BID) | INTRAMUSCULAR | Status: DC
  Administered 2020-07-16 – 2020-07-18 (×6): 62.5 mg via INTRAVENOUS
  Filled 2020-07-16 (×6): qty 2

## 2020-07-16 MED ORDER — ACETAMINOPHEN 325 MG PO TABS
650.0000 mg | ORAL_TABLET | Freq: Four times a day (QID) | ORAL | Status: DC | PRN
  Administered 2020-07-16 – 2020-07-20 (×4): 650 mg via ORAL
  Filled 2020-07-16 (×4): qty 2

## 2020-07-16 MED ORDER — ASCORBIC ACID 500 MG PO TABS
500.0000 mg | ORAL_TABLET | Freq: Every day | ORAL | Status: DC
  Administered 2020-07-16 – 2020-07-21 (×6): 500 mg via ORAL
  Filled 2020-07-16 (×6): qty 1

## 2020-07-16 MED ORDER — SODIUM CHLORIDE 0.9 % IV SOLN
200.0000 mg | Freq: Once | INTRAVENOUS | Status: AC
  Administered 2020-07-16: 200 mg via INTRAVENOUS
  Filled 2020-07-16: qty 200

## 2020-07-16 MED ORDER — SODIUM CHLORIDE 0.9 % IV SOLN
1.0000 g | INTRAVENOUS | Status: DC
  Administered 2020-07-16 – 2020-07-18 (×3): 1 g via INTRAVENOUS
  Filled 2020-07-16 (×2): qty 1
  Filled 2020-07-16: qty 10

## 2020-07-16 NOTE — Progress Notes (Signed)
Patient did not show for appointment.   

## 2020-07-16 NOTE — H&P (Signed)
History and Physical    Alan Owens Alan Owens:[id number] DOB: 1980-12-10 DOA: 07/15/2020  PCP: Jackie Plum, MD  Patient coming from: Home.  Chief Complaint: Vomiting and poor appetite.  History obtained from patient's mother.  HPI: Alan Owens is a 40 y.o. male with known history of cognitive impairment, at baseline noncommunicative with partial blindness, nonischemic cardiomyopathy last EF measured was around 45 to 50%, hypertension hyperlipidemia was brought to the ER after patient was found to have poor appetite with vomiting over the last 24 hours.  Has not made much urine.  Also has been noticed to have some myoclonic jerks and cough.  Did not notice any fever chills or diarrhea.  Has not received any COVID-19 vaccine.  Patient is largely taken care of by patient's mother.  About 3 weeks ago patient had ran out of Lasix and was restarted again with 20 mg.  ED Course: In the ER patient had temperature 101.6 F with chest x-ray showing bilateral infiltrates but patient was not hypoxic Covid test came back positive.  Labs are significant for creatinine of 4.02 with bicarb of 21 potassium was normal lactic acid was normal.  Hemoglobin is at baseline around 11.3.  Patient's creatinine has increased 1.24 in June 2021 and it is around 4.02.  Patient does take lisinopril and it was recently restarted on Lasix.  CT renal study does not show any urinary obstruction but does show bilateral infiltrates concerning for pneumonia.  Review of Systems: As per HPI, rest all negative.   Past Medical History:  Diagnosis Date  . Acid reflux   . Blind left eye   . Congestive heart failure (HCC)   . Hypercholesterolemia   . Hypertension   . Juvenile glaucoma   . Mental developmental delay   . Renal failure   . Vitamin D deficiency     Past Surgical History:  Procedure Laterality Date  . MULTIPLE TOOTH EXTRACTIONS Bilateral 2009   all teeth extracted per patient's mother     reports  that he has never smoked. He has never used smokeless tobacco. He reports that he does not drink alcohol and does not use drugs.  No Known Allergies  Family History  Problem Relation Age of Onset  . Diabetes Sibling   . Hypertension Sibling   . Colon cancer Neg Hx   . Esophageal cancer Neg Hx   . Stomach cancer Neg Hx   . Rectal cancer Neg Hx     Prior to Admission medications   Medication Sig Start Date End Date Taking? Authorizing Provider  ALPHAGAN P 0.1 % SOLN Apply 1 drop to eye in the morning and at bedtime. 05/24/20  Yes [provider]  amLODipine (NORVASC) 2.5 MG tablet Take 1 tablet (2.5 mg total) by mouth daily. 09/12/19  Yes Rosalio Macadamia, NP  ASPIRIN LOW DOSE 81 MG EC tablet TAKE 1 TABLET BY MOUTH EVERY DAY Patient taking differently: Take 81 mg by mouth daily. 12/10/18  Yes Bing Neighbors, FNP  atorvastatin (LIPITOR) 20 MG tablet Take 1 tablet (20 mg total) by mouth daily at 6 PM. 02/08/20  Yes Wendall Stade, MD  carvedilol (COREG) 6.25 MG tablet Take 1 tablet (6.25 mg total) by mouth 2 (two) times daily with a meal. 09/12/19  Yes Rosalio Macadamia, NP  furosemide (LASIX) 20 MG tablet Take 1 tablet (20 mg total) by mouth daily. 06/25/20  Yes Arvilla Market, DO  hydrOXYzine (ATARAX/VISTARIL) 50 MG tablet TAKE 1  TABLET BY MOUTH AT NIGHT Patient taking differently: Take 50 mg by mouth at bedtime. 06/26/20  Yes Arvilla Market, DO  latanoprost (XALATAN) 0.005 % ophthalmic solution Place 1 drop into both eyes at bedtime. 03/14/19  Yes Claiborne Rigg, NP  lisinopril (ZESTRIL) 10 MG tablet Take 1 tablet (10 mg total) by mouth daily. 09/12/19  Yes Rosalio Macadamia, NP  omeprazole (PRILOSEC) 40 MG capsule Take 1 capsule (40 mg total) by mouth daily. 06/25/20  Yes Arvilla Market, DO  potassium chloride SA (KLOR-CON) 20 MEQ tablet Take 1 tablet (20 mEq total) by mouth daily. 09/12/19  Yes Rosalio Macadamia, NP    Physical Exam: Constitutional:  Moderately built and nourished. Vitals:   07/15/20 2012 07/15/20 2239 07/15/20 2328 07/16/20 0137  BP:  116/86 133/79 111/62  Pulse:  99 89 92  Resp:  (!) 22 (!) 21 (!) 22  Temp:  (!) 100.6 F (38.1 C)  100.1 F (37.8 C)  TempSrc:  Oral  Oral  SpO2:  94% 96% 92%  Weight: 102.1 kg     Height: 5\' 7"  (1.702 m)      Eyes: Anicteric no pallor. ENMT: No discharge from the ears eyes nose or mouth. Neck: No mass felt.  No neck rigidity. Respiratory: No rhonchi or crepitations. Cardiovascular: S1-S2 heard. Abdomen: Soft nontender bowel sounds present. Musculoskeletal: No edema. Skin: No rash. Neurologic: Alert awake is at baseline noncommunicative moving all extremities. Psychiatric: Patient has cognitive impairment.   Labs on Admission: I have personally reviewed following labs and imaging studies  CBC: Recent Labs  Lab 07/15/20 2036  WBC 4.2  NEUTROABS 2.2  HGB 11.3*  HCT 32.6*  MCV 90.6  PLT 166   Basic Metabolic Panel: Recent Labs  Lab 07/15/20 2036  NA 136  K 3.6  CL 104  CO2 21*  GLUCOSE 103*  BUN 40*  CREATININE 4.02*  CALCIUM 8.6*   GFR: Estimated Creatinine Clearance: 27.5 mL/min (A) (by C-G formula based on SCr of 4.02 mg/dL (H)). Liver Function Tests: Recent Labs  Lab 07/15/20 2036  AST 30  ALT 22  ALKPHOS 79  BILITOT 0.9  PROT 7.7  ALBUMIN 3.8   Recent Labs  Lab 07/15/20 2036  LIPASE 38   No results for input(s): AMMONIA in the last 168 hours. Coagulation Profile: No results for input(s): INR, PROTIME in the last 168 hours. Cardiac Enzymes: No results for input(s): CKTOTAL, CKMB, CKMBINDEX, TROPONINI in the last 168 hours. BNP (last 3 results) No results for input(s): PROBNP in the last 8760 hours. HbA1C: No results for input(s): HGBA1C in the last 72 hours. CBG: No results for input(s): GLUCAP in the last 168 hours. Lipid Profile: No results for input(s): CHOL, HDL, LDLCALC, TRIG, CHOLHDL, LDLDIRECT in the last 72 hours. Thyroid  Function Tests: No results for input(s): TSH, T4TOTAL, FREET4, T3FREE, THYROIDAB in the last 72 hours. Anemia Panel: No results for input(s): VITAMINB12, FOLATE, FERRITIN, TIBC, IRON, RETICCTPCT in the last 72 hours. Urine analysis: No results found for: COLORURINE, APPEARANCEUR, LABSPEC, PHURINE, GLUCOSEU, HGBUR, BILIRUBINUR, KETONESUR, PROTEINUR, UROBILINOGEN, NITRITE, LEUKOCYTESUR Sepsis Labs: @LABRCNTIP (procalcitonin:4,lacticidven:4) )No results found for this or any previous visit (from the past 240 hour(s)).   Radiological Exams on Admission: DG Chest Port 1 View  Result Date: 07/15/2020 CLINICAL DATA:  Nausea and vomiting with fever and cough. EXAM: PORTABLE CHEST 1 VIEW COMPARISON:  September 17, 2003 FINDINGS: Mildly increased bronchovascular lung markings are seen within the bilateral lung bases. There is  no evidence of a pleural effusion or pneumothorax. The cardiac silhouette is mildly enlarged and unchanged in size. Stable scoliosis is seen involving the thoracic spine without acute osseous abnormality. IMPRESSION: Mildly increased bibasilar bronchovascular lung markings which may represent early infiltrates. Correlation with follow-up chest plain film is recommended. Electronically Signed   By: Virgina Norfolk M.D.   On: 07/15/2020 20:57   CT Renal Stone Study  Result Date: 07/16/2020 CLINICAL DATA:  Nausea and vomiting, COVID-19 positive EXAM: CT ABDOMEN AND PELVIS WITHOUT CONTRAST TECHNIQUE: Multidetector CT imaging of the abdomen and pelvis was performed following the standard protocol without IV contrast. COMPARISON:  None. FINDINGS: Lower chest: Multifocal bibasilar ground-glass airspace disease consistent with COVID 19 pneumonia. Hepatobiliary: No focal liver abnormality is seen. No gallstones, gallbladder wall thickening, or biliary dilatation. Pancreas: Unremarkable. No pancreatic ductal dilatation or surrounding inflammatory changes. Spleen: Normal in size without focal  abnormality. Adrenals/Urinary Tract: Punctate less than 2 mm nonobstructing calculus left kidney image 30/2. No other urinary tract calculi or obstructive uropathy. The bladder is unremarkable. Adrenals are normal. Stomach/Bowel: No bowel obstruction or ileus. No bowel wall thickening or inflammatory change. The appendix, if still present, is not well visualized. Vascular/Lymphatic: No significant vascular findings are present. No enlarged abdominal or pelvic lymph nodes. Reproductive: Prostate is unremarkable. Other: No free fluid or free gas.  No abdominal wall hernia. Musculoskeletal: No acute or destructive bony lesions. Reconstructed images demonstrate no additional findings. IMPRESSION: 1. Multifocal bibasilar pneumonia consistent with COVID 19. 2. Punctate less than 2 mm nonobstructing left renal calculus. Electronically Signed   By: Randa Ngo M.D.   On: 07/16/2020 01:07     Assessment/Plan Principal Problem:   ARF (acute renal failure) (HCC) Active Problems:   Congestive heart failure (HCC)   Cognitive impairment   Pneumonia due to COVID-19 virus    1. Acute renal failure -cause not clear but could be prerenal given that patient was having some nausea vomiting and poor appetite.  Patient is yet to give a sample of urine.  Will check urine creatinine urine urea and urine sodium.  CT renal study does not show any obstruction.  Patient was given 500 cc normal saline bolus we will gently hydrate for now.  Follow intake output and daily weights.  We will hold off patient's Lasix and lisinopril.  Per mother patient was not on any NSAIDs.  If the creatinine does not improve or no urine output will need nephrology input.  Check CK. 2. Covid pneumonia presently not hypoxic but is febrile.  Will check inflammatory markers.  We will keep patient on remdesivir for now follow respiratory status and inflammatory markers closely. 3. Hypertension we will continue amlodipine and Coreg.  Hold lisinopril  due to renal failure.  As needed IV hydralazine. 4. History of nonischemic cardiomyopathy presently holding of Lasix and lisinopril due to renal failure.  Patient receiving fluids due to renal failure. 5. Anemia appears to be chronic follow CBC. 6. Hyperlipidemia on statins.  Check CK. 7. History of cognitive impairment.  Since patient has acute worsening of renal function with Covid infection poor appetite will need close monitoring for any further worsening in patient status.   DVT prophylaxis: Heparin. Code Status: Full code. Family Communication: Patient's mother. Disposition Plan: To be determined. Consults called: None. Admission status: Inpatient.   Rise Patience MD [facility] Pager [phone].  If 7PM-7AM, please contact night-coverage [email or web address] Password [email or web address]  07/16/2020, 2:45 AM

## 2020-07-16 NOTE — Progress Notes (Incomplete)
PROGRESS NOTE    Alan Owens  PNT:[id number] DOB: 01-02-1981 DOA: 07/15/2020 PCP: Jackie Plum, MD (Confirm with patient/family/NH records and if not entered, this HAS to be entered at [facility] point of entry. "No PCP" if truly none.)   No chief complaint on file.   Brief Narrative: (Start on day 1 of progress note - keep it brief and live) ***   Assessment & Plan:   Principal Problem:   ARF (acute renal failure) (HCC) Active Problems:   Congestive heart failure (HCC)   Cognitive impairment   Pneumonia due to COVID-19 virus   ***   DVT prophylaxis: (Lovenox/Heparin/SCD's/anticoagulated/None (if comfort care) Code Status: (Full/Partial - specify details) Family Communication: (Specify name, relationship & date discussed. NO "discussed with patient") Disposition:   Status is: Inpatient  {Inpatient:23812}  Dispo: The patient is from: {From:23814}              Anticipated d/c is to: {To:23815}              Anticipated d/c date is: {Days:23816}              Patient currently {Medically stable:23817}       Consultants:   ***  Procedures: (Don't include imaging studies which can be auto populated. Include things that cannot be auto populated i.e. Echo, Carotid and venous dopplers, Foley, Bipap, HD, tubes/drains, wound vac, central lines etc)  ***  Antimicrobials: (specify start and planned stop date. Auto populated tables are space occupying and do not give end dates)  ***    Subjective: ***  Objective: Vitals:   07/16/20 1045 07/16/20 1100 07/16/20 1115 07/16/20 1130  BP:  (!) 145/112  (!) 121/91  Pulse: 91 95 87 87  Resp:    18  Temp:      TempSrc:      SpO2: 95% 99% 92% 90%  Weight:      Height:        Intake/Output Summary (Last 24 hours) at 07/16/2020 1202 Last data filed at 07/16/2020 0505 Gross per 24 hour  Intake 750 ml  Output -  Net 750 ml   Filed Weights   07/15/20 2012  Weight: 102.1 kg    Examination:  General exam:  Appears calm and comfortable  Respiratory system: Clear to auscultation. Respiratory effort normal. Cardiovascular system: S1 & S2 heard, RRR. No JVD, murmurs, rubs, gallops or clicks. No pedal edema. Gastrointestinal system: Abdomen is nondistended, soft and nontender. No organomegaly or masses felt. Normal bowel sounds heard. Central nervous system: Alert and oriented. No focal neurological deficits. Extremities: Symmetric 5 x 5 power. Skin: No rashes, lesions or ulcers Psychiatry: Judgement and insight appear normal. Mood & affect appropriate.     Data Reviewed: I have personally reviewed following labs and imaging studies  CBC: Recent Labs  Lab 07/15/20 2036 07/16/20 0340  WBC 4.2 4.5  NEUTROABS 2.2  --   HGB 11.3* 10.6*  HCT 32.6* 31.3*  MCV 90.6 91.5  PLT 166 167    Basic Metabolic Panel: Recent Labs  Lab 07/15/20 2036 07/16/20 0242  NA 136  --   K 3.6  --   CL 104  --   CO2 21*  --   GLUCOSE 103*  --   BUN 40*  --   CREATININE 4.02* 3.95*  CALCIUM 8.6*  --     GFR: Estimated Creatinine Clearance: 28 mL/min (A) (by C-G formula based on SCr of 3.95 mg/dL (H)).  Liver Function Tests: Recent  Labs  Lab 07/15/20 2036  AST 30  ALT 22  ALKPHOS 79  BILITOT 0.9  PROT 7.7  ALBUMIN 3.8    CBG: No results for input(s): GLUCAP in the last 168 hours.   No results found for this or any previous visit (from the past 240 hour(s)).       Radiology Studies: DG Chest Port 1 View  Result Date: 07/15/2020 CLINICAL DATA:  Nausea and vomiting with fever and cough. EXAM: PORTABLE CHEST 1 VIEW COMPARISON:  September 17, 2003 FINDINGS: Mildly increased bronchovascular lung markings are seen within the bilateral lung bases. There is no evidence of a pleural effusion or pneumothorax. The cardiac silhouette is mildly enlarged and unchanged in size. Stable scoliosis is seen involving the thoracic spine without acute osseous abnormality. IMPRESSION: Mildly increased bibasilar  bronchovascular lung markings which may represent early infiltrates. Correlation with follow-up chest plain film is recommended. Electronically Signed   By: Aram Candela M.D.   On: 07/15/2020 20:57   CT Renal Stone Study  Result Date: 07/16/2020 CLINICAL DATA:  Nausea and vomiting, COVID-19 positive EXAM: CT ABDOMEN AND PELVIS WITHOUT CONTRAST TECHNIQUE: Multidetector CT imaging of the abdomen and pelvis was performed following the standard protocol without IV contrast. COMPARISON:  None. FINDINGS: Lower chest: Multifocal bibasilar ground-glass airspace disease consistent with COVID 19 pneumonia. Hepatobiliary: No focal liver abnormality is seen. No gallstones, gallbladder wall thickening, or biliary dilatation. Pancreas: Unremarkable. No pancreatic ductal dilatation or surrounding inflammatory changes. Spleen: Normal in size without focal abnormality. Adrenals/Urinary Tract: Punctate less than 2 mm nonobstructing calculus left kidney image 30/2. No other urinary tract calculi or obstructive uropathy. The bladder is unremarkable. Adrenals are normal. Stomach/Bowel: No bowel obstruction or ileus. No bowel wall thickening or inflammatory change. The appendix, if still present, is not well visualized. Vascular/Lymphatic: No significant vascular findings are present. No enlarged abdominal or pelvic lymph nodes. Reproductive: Prostate is unremarkable. Other: No free fluid or free gas.  No abdominal wall hernia. Musculoskeletal: No acute or destructive bony lesions. Reconstructed images demonstrate no additional findings. IMPRESSION: 1. Multifocal bibasilar pneumonia consistent with COVID 19. 2. Punctate less than 2 mm nonobstructing left renal calculus. Electronically Signed   By: Sharlet Salina M.D.   On: 07/16/2020 01:07        Scheduled Meds: . amLODipine  2.5 mg Oral Daily  . vitamin C  500 mg Oral Daily  . atorvastatin  20 mg Oral q1800  . brimonidine  1 drop Both Eyes TID  . carvedilol  6.25 mg  Oral BID WC  . heparin  5,000 Units Subcutaneous Q8H  . hydrOXYzine  50 mg Oral QHS  . Ipratropium-Albuterol  2 puff Inhalation QID  . latanoprost  1 drop Both Eyes QHS  . methylPREDNISolone (SOLU-MEDROL) injection  62.5 mg Intravenous Q12H  . pantoprazole  40 mg Oral Daily  . zinc sulfate  220 mg Oral Daily   Continuous Infusions: . sodium chloride    . [START ON 07/17/2020] remdesivir 100 mg in NS 100 mL       LOS: 1 day    Time spent: ***    Ramiro Harvest, MD [facility]   To contact the attending provider between 7A-7P or the covering provider during after hours 7P-7A, please log into the web site [email or web address] and access using universal [place] password for that web site. If you do not have the password, please call the hospital operator.  07/16/2020, 12:02 PM

## 2020-07-16 NOTE — Progress Notes (Addendum)
I have seen and assessed patient and agree with Dr. Katherene Ponto assessment and plan.  Patient is a 40 year old gentleman history of cognitive impairment, at baseline noncommunicative with partial blindness, nonischemic cardiomyopathy EF 45 to 50%, hypertension, hyperlipidemia brought to the ED due to decreased appetite and vomiting x24 hours.  Patient with poor urine output.  Patient noted to have some myoclonic jerks and a cough.  No fevers or chills or diarrhea.  And vaccinated for COVID-19.  Patient being taken care of by his mother. On presentation patient noted to have a fever with a temp of 101.6, chest x-ray with bilateral infiltrates, positive Covid test, noted not to be hypoxic on admission, patient also noted to be in acute renal failure with a potassium of 4.2.  Patient noted to be on diuretics and ACE inhibitor.  CT renal stone protocol negative for urinary obstruction but showed bilateral infiltrates concerning for pneumonia.  Patient admitted placed on IV remdesivir, inflammatory markers ordered and pending.  ACE inhibitor diuretics held due to acute renal failure.  Urinalysis concerning for UTI.  Place empirically on IV Rocephin pending urine culture results.   No charge.

## 2020-07-17 DIAGNOSIS — I509 Heart failure, unspecified: Secondary | ICD-10-CM | POA: Diagnosis not present

## 2020-07-17 DIAGNOSIS — N179 Acute kidney failure, unspecified: Secondary | ICD-10-CM | POA: Diagnosis not present

## 2020-07-17 DIAGNOSIS — R4189 Other symptoms and signs involving cognitive functions and awareness: Secondary | ICD-10-CM | POA: Diagnosis not present

## 2020-07-17 DIAGNOSIS — U071 COVID-19: Secondary | ICD-10-CM | POA: Diagnosis not present

## 2020-07-17 DIAGNOSIS — I10 Essential (primary) hypertension: Secondary | ICD-10-CM

## 2020-07-17 LAB — COMPREHENSIVE METABOLIC PANEL
ALT: 19 U/L (ref 0–44)
AST: 31 U/L (ref 15–41)
Albumin: 3.3 g/dL — ABNORMAL LOW (ref 3.5–5.0)
Alkaline Phosphatase: 67 U/L (ref 38–126)
Anion gap: 9 (ref 5–15)
BUN: 58 mg/dL — ABNORMAL HIGH (ref 6–20)
CO2: 18 mmol/L — ABNORMAL LOW (ref 22–32)
Calcium: 8.3 mg/dL — ABNORMAL LOW (ref 8.9–10.3)
Chloride: 112 mmol/L — ABNORMAL HIGH (ref 98–111)
Creatinine, Ser: 3.7 mg/dL — ABNORMAL HIGH (ref 0.61–1.24)
GFR, Estimated: 20 mL/min — ABNORMAL LOW (ref >60)
Glucose, Bld: 136 mg/dL — ABNORMAL HIGH (ref 70–99)
Potassium: 4.3 mmol/L (ref 3.5–5.1)
Sodium: 139 mmol/L (ref 135–145)
Total Bilirubin: 0.7 mg/dL (ref 0.3–1.2)
Total Protein: 6.6 g/dL (ref 6.5–8.1)

## 2020-07-17 LAB — CBC WITH DIFFERENTIAL/PLATELET
Abs Immature Granulocytes: 0.01 10*3/uL (ref 0.00–0.07)
Basophils Absolute: 0 10*3/uL (ref 0.0–0.1)
Basophils Relative: 0 %
Eosinophils Absolute: 0 10*3/uL (ref 0.0–0.5)
Eosinophils Relative: 0 %
HCT: 33.7 % — ABNORMAL LOW (ref 39.0–52.0)
Hemoglobin: 10.9 g/dL — ABNORMAL LOW (ref 13.0–17.0)
Immature Granulocytes: 0 %
Lymphocytes Relative: 23 %
Lymphs Abs: 0.9 10*3/uL (ref 0.7–4.0)
MCH: 31.1 pg (ref 26.0–34.0)
MCHC: 32.3 g/dL (ref 30.0–36.0)
MCV: 96 fL (ref 80.0–100.0)
Monocytes Absolute: 0.2 10*3/uL (ref 0.1–1.0)
Monocytes Relative: 4 %
Neutro Abs: 2.9 10*3/uL (ref 1.7–7.7)
Neutrophils Relative %: 73 %
Platelets: 184 10*3/uL (ref 150–400)
RBC: 3.51 MIL/uL — ABNORMAL LOW (ref 4.22–5.81)
RDW: 14.9 % (ref 11.5–15.5)
WBC: 4 10*3/uL (ref 4.0–10.5)
nRBC: 0 % (ref 0.0–0.2)

## 2020-07-17 LAB — UREA NITROGEN, URINE: Urea Nitrogen, Ur: 377 mg/dL

## 2020-07-17 LAB — C-REACTIVE PROTEIN: CRP: 1.4 mg/dL — ABNORMAL HIGH (ref <1.0)

## 2020-07-17 LAB — D-DIMER, QUANTITATIVE: D-Dimer, Quant: 2.25 ug/mL-FEU — ABNORMAL HIGH (ref 0.00–0.50)

## 2020-07-17 MED ORDER — STERILE WATER FOR INJECTION IV SOLN
INTRAVENOUS | Status: DC
  Filled 2020-07-17 (×3): qty 150
  Filled 2020-07-17: qty 850
  Filled 2020-07-17: qty 150
  Filled 2020-07-17 (×2): qty 850

## 2020-07-17 MED ORDER — IPRATROPIUM-ALBUTEROL 20-100 MCG/ACT IN AERS
2.0000 | INHALATION_SPRAY | Freq: Three times a day (TID) | RESPIRATORY_TRACT | Status: DC
  Administered 2020-07-18 (×3): 2 via RESPIRATORY_TRACT
  Filled 2020-07-17: qty 4

## 2020-07-17 NOTE — Progress Notes (Addendum)
PROGRESS NOTE    Alan Owens  GBT:[id number] DOB: 06-Apr-1981 DOA: 07/15/2020 PCP: Jackie Plum, MD    No chief complaint on file.   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  Patient also noted to have poor urine output.  Patient also noted to have some myoclonic jerks with cough.  Patient unvaccinated.  Patient taking care of by his mother who is primary caretaker.  Patient seen in the ED noted to have a fever with a temp of 1016, chest x-ray with bilateral infiltrates, positive Covid test, labs significant for creatinine of 4.02, bicarb of 21, normal lactic acid level.  CT renal stone protocol negative for urinary obstruction however showed bilateral infiltrates concerning for pneumonia.  Urinalysis done consistent with UTI.  Patient noted not to be hypoxic.  Patient placed on IV remdesivir, IV Rocephin, IV Solu-Medrol, IV fluids.  Supportive care.   Assessment & Plan:   Principal Problem:   ARF (acute renal failure) (HCC) Active Problems:   Congestive heart failure (HCC)   Cognitive impairment   Pneumonia due to COVID-19 virus   Hypertension  1 acute renal failure Likely secondary to prerenal azotemia due to GI losses from nausea, poor oral intake in the setting of diuretics and ACE inhibitor.  Urinalysis done also concerning for UTI.  CT renal stone protocol negative for urinary obstruction.  Patient noted not to be on any NSAIDs.  Urine sodium noted at 46, urine creatinine at 321.85.  Patient placed on IV fluids with urine output of 500 cc over the past 24 hours however unsure of accuracy.  Creatinine trending down currently at 3.70 from 4.02 on admission.  Continue IV fluids, supportive care.  Follow renal function.  Hold ACE inhibitor and  diuretics.  Avoid nephrotoxins.  2. COVID-19 positive test (U07.1, COVID-19) with Acute Pneumonia (J12.89, Other viral pneumonia) (If respiratory failure or sepsis present, add as separate assessment Patient presented with fever, chest x-ray done concerning for bilateral infiltrates.  Patient noted not to be hypoxic.  D-dimer elevated at 2.64.  Ferritin not obtained.  CRP at 0.7.  Procalcitonin of 0.30.  Continue IV remdesivir, IV Solu-Medrol, vitamin C, zinc, Combivent.  Supportive care.  Follow.  3.  Cognitive impairment  4.  Hypertension Continue Coreg, amlodipine.  ACE inhibitor on hold secondary to problem #1.  5.  History of nonischemic cardiomyopathy Lasix and ACE inhibitor on hold due to acute renal failure.  On gentle hydration.  Monitor closely for volume overload.  6.  Hyperlipidemia Statin.  7.  Anemia Likely chronic in nature.  Patient with no overt bleeding.  Follow H&H.  Transfusion threshold hemoglobin < 7.  8.  UTI Urine cultures pending.  Continue IV Rocephin.   DVT prophylaxis: Heparin Code Status: Full Family Communication: Updated mother at bedside. Disposition:   Status is: Inpatient    Dispo: The patient is from: Home              Anticipated d/c is to: Home              Anticipated d/c date is: 07/22/2020              Patient currently on IV remdesivir, IV antibiotics, not stable for discharge.       Consultants:   None  Procedures:  CT renal stone protocol 07/16/2020  Chest x-ray 07/15/2020  Antimicrobials:   IV Rocephin 07/16/2020>>>>  IV remdesivir 07/16/2020>>>> 07/21/2020   Subjective: Patient sitting up.  Alert.  States he is feeling somewhat better.  No chest pain or shortness of breath.  Per mother patient has been eating today.  Objective: Vitals:   07/17/20 0206 07/17/20 0500 07/17/20 0531 07/17/20 1401  BP: (!) 135/91  (!) 128/91 135/80  Pulse: 84  79 71  Resp: 18  18 20   Temp: 98.6 F (37 C)  97.7 F (36.5 C) 99.3 F  (37.4 C)  TempSrc: Oral  Oral Axillary  SpO2: 96%  98% 98%  Weight:  108.2 kg    Height:        Intake/Output Summary (Last 24 hours) at 07/17/2020 1949 Last data filed at 07/17/2020 1744 Gross per 24 hour  Intake 2349.3 ml  Output --  Net 2349.3 ml   Filed Weights   07/15/20 2012 07/17/20 0500  Weight: 102.1 kg 108.2 kg    Examination:  General exam: NAD Respiratory system: CTA B anterior lung fields.  No wheezes, no crackles, no rhonchi.   Cardiovascular system: S1 & S2 heard, RRR. No JVD, murmurs, rubs, gallops or clicks. No pedal edema. Gastrointestinal system: Abdomen is nondistended, soft and nontender. No organomegaly or masses felt. Normal bowel sounds heard. Central nervous system: Alert and oriented. No focal neurological deficits. Extremities: Symmetric 5 x 5 power. Skin: No rashes, lesions or ulcers Psychiatry: Judgement and insight appear normal. Mood & affect appropriate.     Data Reviewed: I have personally reviewed following labs and imaging studies  CBC: Recent Labs  Lab 07/15/20 2036 07/16/20 0340 07/17/20 0455  WBC 4.2 4.5 4.0  NEUTROABS 2.2  --  2.9  HGB 11.3* 10.6* 10.9*  HCT 32.6* 31.3* 33.7*  MCV 90.6 91.5 96.0  PLT 166 167 184    Basic Metabolic Panel: Recent Labs  Lab 07/15/20 2036 07/16/20 0242 07/17/20 0455  NA 136  --  139  K 3.6  --  4.3  CL 104  --  112*  CO2 21*  --  18*  GLUCOSE 103*  --  136*  BUN 40*  --  58*  CREATININE 4.02* 3.95* 3.70*  CALCIUM 8.6*  --  8.3*    GFR: Estimated Creatinine Clearance: 30.8 mL/min (A) (by C-G formula based on SCr of 3.7 mg/dL (H)).  Liver Function Tests: Recent Labs  Lab 07/15/20 2036 07/17/20 0455  AST 30 31  ALT 22 19  ALKPHOS 79 67  BILITOT 0.9 0.7  PROT 7.7 6.6  ALBUMIN 3.8 3.3*    CBG: No results for input(s): GLUCAP in the last 168 hours.   No results found for this or any previous visit (from the past 240 hour(s)).       Radiology Studies: DG Chest Port 1  View  Result Date: 07/15/2020 CLINICAL DATA:  Nausea and vomiting with fever and cough. EXAM: PORTABLE CHEST 1 VIEW COMPARISON:  September 17, 2003 FINDINGS: Mildly increased bronchovascular lung markings are seen within the bilateral lung bases. There is no evidence of a pleural effusion or pneumothorax. The cardiac silhouette is mildly enlarged and unchanged in size. Stable scoliosis is seen involving the thoracic spine without acute osseous abnormality. IMPRESSION: Mildly increased bibasilar bronchovascular lung markings which may represent early infiltrates. Correlation with follow-up chest plain film is recommended. Electronically Signed   By: September 19, 2003 M.D.   On: 07/15/2020 20:57   CT Renal  Stone Study  Result Date: 07/16/2020 CLINICAL DATA:  Nausea and vomiting, COVID-19 positive EXAM: CT ABDOMEN AND PELVIS WITHOUT CONTRAST TECHNIQUE: Multidetector CT imaging of the abdomen and pelvis was performed following the standard protocol without IV contrast. COMPARISON:  None. FINDINGS: Lower chest: Multifocal bibasilar ground-glass airspace disease consistent with COVID 19 pneumonia. Hepatobiliary: No focal liver abnormality is seen. No gallstones, gallbladder wall thickening, or biliary dilatation. Pancreas: Unremarkable. No pancreatic ductal dilatation or surrounding inflammatory changes. Spleen: Normal in size without focal abnormality. Adrenals/Urinary Tract: Punctate less than 2 mm nonobstructing calculus left kidney image 30/2. No other urinary tract calculi or obstructive uropathy. The bladder is unremarkable. Adrenals are normal. Stomach/Bowel: No bowel obstruction or ileus. No bowel wall thickening or inflammatory change. The appendix, if still present, is not well visualized. Vascular/Lymphatic: No significant vascular findings are present. No enlarged abdominal or pelvic lymph nodes. Reproductive: Prostate is unremarkable. Other: No free fluid or free gas.  No abdominal wall hernia.  Musculoskeletal: No acute or destructive bony lesions. Reconstructed images demonstrate no additional findings. IMPRESSION: 1. Multifocal bibasilar pneumonia consistent with COVID 19. 2. Punctate less than 2 mm nonobstructing left renal calculus. Electronically Signed   By: Randa Ngo M.D.   On: 07/16/2020 01:07        Scheduled Meds: . amLODipine  2.5 mg Oral Daily  . vitamin C  500 mg Oral Daily  . atorvastatin  20 mg Oral q1800  . brimonidine  1 drop Both Eyes TID  . carvedilol  6.25 mg Oral BID WC  . heparin  5,000 Units Subcutaneous Q8H  . hydrOXYzine  50 mg Oral QHS  . Ipratropium-Albuterol  2 puff Inhalation QID  . latanoprost  1 drop Both Eyes QHS  . methylPREDNISolone (SOLU-MEDROL) injection  62.5 mg Intravenous Q12H  . pantoprazole  40 mg Oral Daily  . zinc sulfate  220 mg Oral Daily   Continuous Infusions: . cefTRIAXone (ROCEPHIN)  IV 1 g (07/16/20 2038)  . remdesivir 100 mg in NS 100 mL Stopped (07/17/20 1127)  .  sodium bicarbonate (isotonic) infusion in sterile water 125 mL/hr at 07/17/20 1156     LOS: 2 days    Time spent: 40 minutes    Irine Seal, MD [facility]   To contact the attending provider between 7A-7P or the covering provider during after hours 7P-7A, please log into the web site [email or web address] and access using universal [place] password for that web site. If you do not have the password, please call the hospital operator.  07/17/2020, 7:49 PM

## 2020-07-18 DIAGNOSIS — N179 Acute kidney failure, unspecified: Secondary | ICD-10-CM | POA: Diagnosis not present

## 2020-07-18 DIAGNOSIS — R4189 Other symptoms and signs involving cognitive functions and awareness: Secondary | ICD-10-CM | POA: Diagnosis not present

## 2020-07-18 DIAGNOSIS — I509 Heart failure, unspecified: Secondary | ICD-10-CM | POA: Diagnosis not present

## 2020-07-18 DIAGNOSIS — U071 COVID-19: Secondary | ICD-10-CM | POA: Diagnosis not present

## 2020-07-18 LAB — COMPREHENSIVE METABOLIC PANEL
ALT: 20 U/L (ref 0–44)
AST: 29 U/L (ref 15–41)
Albumin: 3.2 g/dL — ABNORMAL LOW (ref 3.5–5.0)
Alkaline Phosphatase: 65 U/L (ref 38–126)
Anion gap: 11 (ref 5–15)
BUN: 65 mg/dL — ABNORMAL HIGH (ref 6–20)
CO2: 24 mmol/L (ref 22–32)
Calcium: 8.4 mg/dL — ABNORMAL LOW (ref 8.9–10.3)
Chloride: 107 mmol/L (ref 98–111)
Creatinine, Ser: 3.05 mg/dL — ABNORMAL HIGH (ref 0.61–1.24)
GFR, Estimated: 25 mL/min — ABNORMAL LOW (ref >60)
Glucose, Bld: 145 mg/dL — ABNORMAL HIGH (ref 70–99)
Potassium: 3.9 mmol/L (ref 3.5–5.1)
Sodium: 142 mmol/L (ref 135–145)
Total Bilirubin: 0.7 mg/dL (ref 0.3–1.2)
Total Protein: 6.7 g/dL (ref 6.5–8.1)

## 2020-07-18 LAB — CBC WITH DIFFERENTIAL/PLATELET
Abs Immature Granulocytes: 0.04 10*3/uL (ref 0.00–0.07)
Basophils Absolute: 0 10*3/uL (ref 0.0–0.1)
Basophils Relative: 0 %
Eosinophils Absolute: 0 10*3/uL (ref 0.0–0.5)
Eosinophils Relative: 0 %
HCT: 33.7 % — ABNORMAL LOW (ref 39.0–52.0)
Hemoglobin: 11.4 g/dL — ABNORMAL LOW (ref 13.0–17.0)
Immature Granulocytes: 1 %
Lymphocytes Relative: 15 %
Lymphs Abs: 1.3 10*3/uL (ref 0.7–4.0)
MCH: 30.9 pg (ref 26.0–34.0)
MCHC: 33.8 g/dL (ref 30.0–36.0)
MCV: 91.3 fL (ref 80.0–100.0)
Monocytes Absolute: 0.6 10*3/uL (ref 0.1–1.0)
Monocytes Relative: 7 %
Neutro Abs: 6.8 10*3/uL (ref 1.7–7.7)
Neutrophils Relative %: 77 %
Platelets: 212 10*3/uL (ref 150–400)
RBC: 3.69 MIL/uL — ABNORMAL LOW (ref 4.22–5.81)
RDW: 14.4 % (ref 11.5–15.5)
WBC: 8.7 10*3/uL (ref 4.0–10.5)
nRBC: 0 % (ref 0.0–0.2)

## 2020-07-18 LAB — D-DIMER, QUANTITATIVE: D-Dimer, Quant: 1.59 ug/mL-FEU — ABNORMAL HIGH (ref 0.00–0.50)

## 2020-07-18 LAB — C-REACTIVE PROTEIN: CRP: 0.8 mg/dL (ref <1.0)

## 2020-07-18 MED ORDER — IPRATROPIUM-ALBUTEROL 20-100 MCG/ACT IN AERS
2.0000 | INHALATION_SPRAY | Freq: Two times a day (BID) | RESPIRATORY_TRACT | Status: DC
  Administered 2020-07-19 – 2020-07-21 (×5): 2 via RESPIRATORY_TRACT

## 2020-07-18 NOTE — Progress Notes (Signed)
PROGRESS NOTE    Alan Owens  OIZ:[id number] DOB: December 12, 1980 DOA: 07/15/2020 PCP: Jackie Plum, MD    No chief complaint on file.   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  Patient also noted to have poor urine output.  Patient also noted to have some myoclonic jerks with cough.  Patient unvaccinated.  Patient taking care of by his mother who is primary caretaker.  Patient seen in the ED noted to have a fever with a temp of 1016, chest x-ray with bilateral infiltrates, positive Covid test, labs significant for creatinine of 4.02, bicarb of 21, normal lactic acid level.  CT renal stone protocol negative for urinary obstruction however showed bilateral infiltrates concerning for pneumonia.  Urinalysis done consistent with UTI.  Patient noted not to be hypoxic.  Patient placed on IV remdesivir, IV Rocephin, IV Solu-Medrol, IV fluids.  Supportive care.   Assessment & Plan:   Principal Problem:   ARF (acute renal failure) (HCC) Active Problems:   Congestive heart failure (HCC)   Cognitive impairment   Pneumonia due to COVID-19 virus   Hypertension  1 acute renal failure Likely secondary to prerenal azotemia due to GI losses from nausea, poor oral intake in the setting of diuretics and ACE inhibitor.  Urinalysis done also concerning for UTI.  CT renal stone protocol negative for urinary obstruction.  Patient noted not to be on any NSAIDs.  Urine sodium noted at 46, urine creatinine at 321.85.  Patient placed on IV fluids with urine output of 275 cc over the past 24 hours however unsure of accuracy of this urine output.  Renal function improving creatinine trending down currently at 3.05 from 3.70 from 4.02 on admission.  Continue IV fluids, supportive  care.  Follow renal function.  Continue to hold ACE inhibitor and diuretics.  Avoid nephrotoxins.  Follow.  2. COVID-19 positive test (U07.1, COVID-19) with Acute Pneumonia (J12.89, Other viral pneumonia) (If respiratory failure or sepsis present, add as separate assessment Patient presented with fever, chest x-ray done concerning for bilateral infiltrates.  Patient noted not to be hypoxic.  D-dimer elevated at 2.64 and trending down currently at 1.59.  Ferritin not obtained.  CRP at 0.7 initially and trended up to 1.4 back down to 0.8.Marland Kitchen  Procalcitonin of 0.30.  Continue IV remdesivir, IV Solu-Medrol, vitamin C, zinc, Combivent.  Supportive care.  Follow.  3.  Cognitive impairment  4.  Hypertension Controlled on current regimen of amlodipine and Coreg.  Continue to hold ACE inhibitor and diuretics secondary to acute renal failure.    5.  History of nonischemic cardiomyopathy Lasix and ACE inhibitor on hold due to acute renal failure.  On gentle hydration.  Monitor closely for volume overload.  6.  Hyperlipidemia Statin.  7.  Anemia Likely chronic in nature.  Patient with no overt bleeding.  Follow H&H.  Transfusion threshold hemoglobin < 7.  8.  UTI Urine cultures with 10,000 colonies of Proteus mirabilis.  DC IV antibiotics after today's dose as patient would have had 3 days of antibiotics.  No further antibiotics needed at this time.    DVT prophylaxis: Heparin Code Status: Full Family Communication: Updated brother at bedside. Disposition:   Status is: Inpatient    Dispo: The patient is from: Home  Anticipated d/c is to: Home              Anticipated d/c date is: 07/21/2020              Patient currently on IV remdesivir, IV antibiotics, not stable for discharge.       Consultants:   None  Procedures:   CT renal stone protocol 07/16/2020  Chest x-ray 07/15/2020  Antimicrobials:   IV Rocephin 07/16/2020>>>> 07/18/2020  IV remdesivir 07/16/2020>>>>  07/21/2020   Subjective: Patient laying in bed.  Feeling better.  No chest pain.  No shortness of breath.  Brother at bedside.    Objective: Vitals:   07/17/20 2245 07/18/20 0436 07/18/20 0500 07/18/20 1100  BP: 112/69 133/84  135/82  Pulse: 77 78    Resp: 18 20    Temp: 98.6 F (37 C) 97.7 F (36.5 C)    TempSrc: Oral Oral    SpO2: 93% 98%    Weight:   99.7 kg   Height:        Intake/Output Summary (Last 24 hours) at 07/18/2020 1203 Last data filed at 07/18/2020 0900 Gross per 24 hour  Intake 2278.33 ml  Output 375 ml  Net 1903.33 ml   Filed Weights   07/15/20 2012 07/17/20 0500 07/18/20 0500  Weight: 102.1 kg 108.2 kg 99.7 kg    Examination:  General exam: NAD Respiratory system: Lungs clear to auscultation anterior lung fields.  No wheezes, no crackles, no rhonchi.  Normal respiratory effort.     Cardiovascular system: Regular rate rhythm no murmurs rubs or gallops.  No JVD.  No lower extremity edema.  Gastrointestinal system: Abdomen is soft, nontender, nondistended, positive bowel sounds.  No rebound.  No guarding.  Central nervous system: Alert and oriented. No focal neurological deficits. Extremities: Symmetric 5 x 5 power. Skin: No rashes, lesions or ulcers Psychiatry: Judgement and insight appear poor. Mood & affect appropriate.     Data Reviewed: I have personally reviewed following labs and imaging studies  CBC: Recent Labs  Lab 07/15/20 2036 07/16/20 0340 07/17/20 0455 07/18/20 0432  WBC 4.2 4.5 4.0 8.7  NEUTROABS 2.2  --  2.9 6.8  HGB 11.3* 10.6* 10.9* 11.4*  HCT 32.6* 31.3* 33.7* 33.7*  MCV 90.6 91.5 96.0 91.3  PLT 166 167 184 161    Basic Metabolic Panel: Recent Labs  Lab 07/15/20 2036 07/16/20 0242 07/17/20 0455 07/18/20 0432  NA 136  --  139 142  K 3.6  --  4.3 3.9  CL 104  --  112* 107  CO2 21*  --  18* 24  GLUCOSE 103*  --  136* 145*  BUN 40*  --  58* 65*  CREATININE 4.02* 3.95* 3.70* 3.05*  CALCIUM 8.6*  --  8.3* 8.4*     GFR: Estimated Creatinine Clearance: 35.8 mL/min (A) (by C-G formula based on SCr of 3.05 mg/dL (H)).  Liver Function Tests: Recent Labs  Lab 07/15/20 2036 07/17/20 0455 07/18/20 0432  AST 30 31 29   ALT 22 19 20   ALKPHOS 79 67 65  BILITOT 0.9 0.7 0.7  PROT 7.7 6.6 6.7  ALBUMIN 3.8 3.3* 3.2*    CBG: No results for input(s): GLUCAP in the last 168 hours.   Recent Results (from the past 240 hour(s))  Culture, Urine     Status: Abnormal (Preliminary result)   Collection Time: 07/16/20 12:55 PM   Specimen: Urine, Catheterized  Result Value Ref Range Status   Specimen Description  Final    URINE, CATHETERIZED Performed at [facility], [address] [place], [place] [address]    Special Requests   Final    NONE Performed at [facility], [address] [place], [place] [address]    Culture (A)  Final    10,000 COLONIES/mL GRAM NEGATIVE RODS SUSCEPTIBILITIES TO FOLLOW Performed at [facility], [address] [place], [place] [address]    Report Status PENDING  Incomplete         Radiology Studies: No results found.      Scheduled Meds: . amLODipine  2.5 mg Oral Daily  . vitamin C  500 mg Oral Daily  . atorvastatin  20 mg Oral q1800  . brimonidine  1 drop Both Eyes TID  . carvedilol  6.25 mg Oral BID WC  . heparin  5,000 Units Subcutaneous Q8H  . hydrOXYzine  50 mg Oral QHS  . Ipratropium-Albuterol  2 puff Inhalation TID  . latanoprost  1 drop Both Eyes QHS  . methylPREDNISolone (SOLU-MEDROL) injection  62.5 mg Intravenous Q12H  . pantoprazole  40 mg Oral Daily  . zinc sulfate  220 mg Oral Daily   Continuous Infusions: . cefTRIAXone (ROCEPHIN)  IV 1 g (07/17/20 2011)  . remdesivir 100 mg in NS 100 mL 100 mg (07/18/20 1104)  .  sodium bicarbonate (isotonic) infusion in sterile water 125 mL/hr at 07/18/20 0744     LOS: 3 days    Time spent: 35 minutes    Ramiro Harvest, MD [facility]   To contact the attending provider between 7A-7P or the covering provider during after hours 7P-7A, please log into the web site [email or web address] and access using universal [place] password for that web site. If you do not have the password, please call the hospital operator.  07/18/2020, 12:03 PM

## 2020-07-19 DIAGNOSIS — R4189 Other symptoms and signs involving cognitive functions and awareness: Secondary | ICD-10-CM | POA: Diagnosis not present

## 2020-07-19 DIAGNOSIS — N179 Acute kidney failure, unspecified: Secondary | ICD-10-CM | POA: Diagnosis not present

## 2020-07-19 DIAGNOSIS — I509 Heart failure, unspecified: Secondary | ICD-10-CM | POA: Diagnosis not present

## 2020-07-19 DIAGNOSIS — U071 COVID-19: Secondary | ICD-10-CM | POA: Diagnosis not present

## 2020-07-19 LAB — CBC WITH DIFFERENTIAL/PLATELET
Abs Immature Granulocytes: 0.07 10*3/uL (ref 0.00–0.07)
Basophils Absolute: 0 10*3/uL (ref 0.0–0.1)
Basophils Relative: 0 %
Eosinophils Absolute: 0 10*3/uL (ref 0.0–0.5)
Eosinophils Relative: 0 %
HCT: 32.9 % — ABNORMAL LOW (ref 39.0–52.0)
Hemoglobin: 11 g/dL — ABNORMAL LOW (ref 13.0–17.0)
Immature Granulocytes: 1 %
Lymphocytes Relative: 10 %
Lymphs Abs: 0.8 10*3/uL (ref 0.7–4.0)
MCH: 30.4 pg (ref 26.0–34.0)
MCHC: 33.4 g/dL (ref 30.0–36.0)
MCV: 90.9 fL (ref 80.0–100.0)
Monocytes Absolute: 0.6 10*3/uL (ref 0.1–1.0)
Monocytes Relative: 8 %
Neutro Abs: 6.3 10*3/uL (ref 1.7–7.7)
Neutrophils Relative %: 81 %
Platelets: 209 10*3/uL (ref 150–400)
RBC: 3.62 MIL/uL — ABNORMAL LOW (ref 4.22–5.81)
RDW: 14.1 % (ref 11.5–15.5)
WBC: 7.8 10*3/uL (ref 4.0–10.5)
nRBC: 0 % (ref 0.0–0.2)

## 2020-07-19 LAB — COMPREHENSIVE METABOLIC PANEL
ALT: 19 U/L (ref 0–44)
AST: 29 U/L (ref 15–41)
Albumin: 3 g/dL — ABNORMAL LOW (ref 3.5–5.0)
Alkaline Phosphatase: 65 U/L (ref 38–126)
Anion gap: 13 (ref 5–15)
BUN: 70 mg/dL — ABNORMAL HIGH (ref 6–20)
CO2: 31 mmol/L (ref 22–32)
Calcium: 7.8 mg/dL — ABNORMAL LOW (ref 8.9–10.3)
Chloride: 100 mmol/L (ref 98–111)
Creatinine, Ser: 2.75 mg/dL — ABNORMAL HIGH (ref 0.61–1.24)
GFR, Estimated: 29 mL/min — ABNORMAL LOW (ref >60)
Glucose, Bld: 139 mg/dL — ABNORMAL HIGH (ref 70–99)
Potassium: 3 mmol/L — ABNORMAL LOW (ref 3.5–5.1)
Sodium: 144 mmol/L (ref 135–145)
Total Bilirubin: 0.6 mg/dL (ref 0.3–1.2)
Total Protein: 6.3 g/dL — ABNORMAL LOW (ref 6.5–8.1)

## 2020-07-19 LAB — URINE CULTURE: Culture: 10000 — AB

## 2020-07-19 LAB — C-REACTIVE PROTEIN: CRP: <0.5 mg/dL (ref <1.0)

## 2020-07-19 LAB — D-DIMER, QUANTITATIVE: D-Dimer, Quant: 1.09 ug/mL-FEU — ABNORMAL HIGH (ref 0.00–0.50)

## 2020-07-19 LAB — MAGNESIUM: Magnesium: 2.1 mg/dL (ref 1.7–2.4)

## 2020-07-19 MED ORDER — PHENOL 1.4 % MT LIQD
1.0000 | OROMUCOSAL | Status: DC | PRN
  Filled 2020-07-19: qty 177

## 2020-07-19 MED ORDER — SODIUM CHLORIDE 0.45 % IV SOLN: INTRAVENOUS | Status: DC

## 2020-07-19 MED ORDER — ENOXAPARIN SODIUM 60 MG/0.6ML ~~LOC~~ SOLN
50.0000 mg | SUBCUTANEOUS | Status: DC
  Administered 2020-07-19 – 2020-07-20 (×2): 50 mg via SUBCUTANEOUS
  Filled 2020-07-19 (×2): qty 0.6

## 2020-07-19 MED ORDER — METHYLPREDNISOLONE SODIUM SUCC 125 MG IJ SOLR
62.5000 mg | Freq: Every day | INTRAMUSCULAR | Status: DC
  Administered 2020-07-19 – 2020-07-21 (×3): 62.5 mg via INTRAVENOUS
  Filled 2020-07-19 (×3): qty 2

## 2020-07-19 MED ORDER — POTASSIUM CHLORIDE CRYS ER 20 MEQ PO TBCR
40.0000 meq | EXTENDED_RELEASE_TABLET | Freq: Once | ORAL | Status: AC
  Administered 2020-07-19: 40 meq via ORAL
  Filled 2020-07-19: qty 2

## 2020-07-19 NOTE — Progress Notes (Signed)
Occupational Therapy Evaluation Patient Details Name: Alan Owens MRN: [id number] DOB: 1980/12/25 Today's Date: 07/19/2020    History of Present Illness Pasqual L Busker is a 40 y.o. male with known history of cognitive impairment, at baseline noncommunicative with partial blindness, nonischemic cardiomyopathy last EF measured was around 45 to 50%, hypertension hyperlipidemia was brought to the ER after patient was found to have poor appetite with vomiting prior to admission. Admitted with acute renal failure and found to be Covid +.   Clinical Impression   Unsure pt's prior level of functioning secondary to cognitive limitations and no family available to provide information.  Pt received sitting in bed, agreeable to long sitting in bed, which he progressed to modified independently. Pt presents with cognitive limitations and physical limitations impacting safety and independence with ADL. Pt with cognitive impairment at baseline (see cognition section). Pt washed BLE in long sitting in bed, he would require assistance for more thorough bathing. Pt will continue to benefit from skilled OT services to maximize safety and independence with ADL/IADL and functional mobility. Will continue to follow acutely and progress as tolerated.    Follow Up Recommendations  No OT follow up;Supervision/Assistance - 24 hour    Equipment Recommendations  None recommended by OT    Recommendations for Other Services       Precautions / Restrictions Precautions Precautions: Fall Restrictions Weight Bearing Restrictions: No      Mobility Bed Mobility Overal bed mobility: Modified Independent             General bed mobility comments: Pt able to move in bed without A and come into log sitting without A.  Pt declined sitting EOB.    Transfers                      Balance Overall balance assessment: Mild deficits observed, not formally tested                                          ADL either performed or assessed with clinical judgement   ADL Overall ADL's : Needs assistance/impaired         Upper Body Bathing: Minimal assistance Upper Body Bathing Details (indicate cue type and reason): pt washed arm with washcloth, would require assistance for thorough cleaning Lower Body Bathing: Minimal assistance;Sitting/lateral leans Lower Body Bathing Details (indicate cue type and reason): pt washing legs with washcloth provided by therapist       Lower Body Dressing Details (indicate cue type and reason): pt declined attempt to don socks   Toilet Transfer Details (indicate cue type and reason): pt declined OOB           General ADL Comments: pt with self-limiting behaviors this session, he declined to put on socks and appeared to become frustrated when prompted by therapists;Pt sat upright in bed and washed arm and legs. He declined further mobility progression.     Vision   Additional Comments: difficult to assess secondary to cognition     Perception     Praxis      Pertinent Vitals/Pain Pain Assessment: Faces Faces Pain Scale: No hurt Pain Intervention(s): Monitored during session     Hand Dominance     Extremity/Trunk Assessment Upper Extremity Assessment Upper Extremity Assessment: Overall WFL for tasks assessed   Lower Extremity Assessment Lower Extremity Assessment: Defer to PT evaluation  Communication Communication Communication: Expressive difficulties   Cognition Arousal/Alertness: Awake/alert Behavior During Therapy: Restless;Impulsive Overall Cognitive Status: History of cognitive impairments - at baseline                                 General Comments: Pt following commands <15% of the time, pt fluctuated with joking and being silly with therapist and then appeared to become frustrated and upset;Pt with jovial humor, poking at therapists and giggling, pt sniffing around room and then  stating "P-U" "feet stink" and laughing.   General Comments       Exercises     Shoulder Instructions      Home Living Family/patient expects to be discharged to:: Private residence Living Arrangements: Parent (lives with his mother)                               Additional Comments: unable to get any further info on home set up;no family present during this session      Prior Functioning/Environment Level of Independence: Needs assistance        Comments: anticipate pt required assistance, no family available to provide prior level of functioning information        OT Problem List: Decreased activity tolerance;Impaired balance (sitting and/or standing);Decreased safety awareness;Decreased cognition;Cardiopulmonary status limiting activity      OT Treatment/Interventions: Self-care/ADL training;Therapeutic exercise;Energy conservation;DME and/or AE instruction;Therapeutic activities;Patient/family education;Balance training    OT Goals(Current goals can be found in the care plan section) Acute Rehab OT Goals Patient Stated Goal: stay in bed OT Goal Formulation: With patient Time For Goal Achievement: 08/02/20 Potential to Achieve Goals: Fair ADL Goals Pt Will Perform Eating: with set-up;sitting Pt Will Perform Grooming: with supervision;sitting Pt Will Transfer to Toilet: with min guard assist;ambulating  OT Frequency: Min 1X/week   Barriers to D/C:            Co-evaluation PT/OT/SLP Co-Evaluation/Treatment: Yes Reason for Co-Treatment: For patient/therapist safety;To address functional/ADL transfers;Complexity of the patient's impairments (multi-system involvement) PT goals addressed during session: Mobility/safety with mobility OT goals addressed during session: ADL's and self-care      AM-PAC OT "6 Clicks" Daily Activity     Outcome Measure Help from another person eating meals?: A Little Help from another person taking care of personal  grooming?: Total Help from another person toileting, which includes using toliet, bedpan, or urinal?: Total Help from another person bathing (including washing, rinsing, drying)?: Total Help from another person to put on and taking off regular upper body clothing?: A Lot Help from another person to put on and taking off regular lower body clothing?: Total 6 Click Score: 9   End of Session Nurse Communication: Mobility status  Activity Tolerance: Other (comment) (pt with self-limiting behaviors) Patient left: in bed;with call bell/phone within reach;with bed alarm set;with restraints reapplied  OT Visit Diagnosis: Other abnormalities of gait and mobility (R26.89);Other symptoms and signs involving cognitive function                Time: 1455-1520 OT Time Calculation (min): 25 min Charges:  OT General Charges $OT Visit: 1 Visit OT Evaluation $OT Eval Moderate Complexity: 1 Mod  Yaire Kreher OTR/L Acute Rehabilitation Services Office: [phone]   Rebeca Alert 07/19/2020, 4:23 PM

## 2020-07-19 NOTE — Plan of Care (Signed)
  Problem: Education: Goal: Knowledge of risk factors and measures for prevention of condition will improve Outcome: Progressing   Problem: Respiratory: Goal: Will maintain a patent airway Outcome: Progressing   Problem: Education: Goal: Knowledge of risk factors and measures for prevention of condition will improve Outcome: Progressing   Problem: Coping: Goal: Psychosocial and spiritual needs will be supported Outcome: Progressing   Problem: Respiratory: Goal: Will maintain a patent airway Outcome: Progressing   Problem: Health Behavior/Discharge Planning: Goal: Ability to manage health-related needs will improve Outcome: Progressing   Problem: Safety: Goal: Ability to remain free from injury will improve Outcome: Progressing

## 2020-07-19 NOTE — Progress Notes (Addendum)
Family at bedside reports pts tongue is swollen and hanging out of his mouth more than usual. Family states they are concerned about fluid overload. Pts tongue is swollen, pt in no respiratory distress and denies SOB. Lungs diminished but clear. Pt clearing throat frequently. Linton Flemings NP notified via Amion. Order to hold fluids for 6 hours and monitor pt.

## 2020-07-19 NOTE — Evaluation (Signed)
Physical Therapy Evaluation Patient Details Name: Alan Owens MRN: [id number] DOB: 08-Feb-1981 Today's Date: 07/19/2020   History of Present Illness  Alan Owens is a 40 y.o. male with known history of cognitive impairment, at baseline noncommunicative with partial blindness, nonischemic cardiomyopathy last EF measured was around 45 to 50%, hypertension hyperlipidemia was brought to the ER after patient was found to have poor appetite with vomiting prior to admission. Admitted with acute renal failure and found to be Covid +.  Clinical Impression  Pt admitted with above diagnosis.  Pt currently with functional limitations due to the deficits listed below (see PT Problem List). Pt will benefit from skilled PT to increase their independence and safety with mobility to allow discharge to the venue listed below.  Evaluation limited by pt only agreeing to perform long sitting, but appears he is probably close to baseline.  Will attempt to see pt when family member is present as feel he may do better with someone he knows in the room.     Follow Up Recommendations No PT follow up    Equipment Recommendations  Other (comment)    Recommendations for Other Services       Precautions / Restrictions Precautions Precautions: Fall      Mobility  Bed Mobility Overal bed mobility: Modified Independent             General bed mobility comments: Pt able to move in bed without A and come into log sitting without A.  Pt declined sitting EOB.    Transfers                    Ambulation/Gait                Stairs            Wheelchair Mobility    Modified Rankin (Stroke Patients Only)       Balance                                             Pertinent Vitals/Pain      Home Living Family/patient expects to be discharged to:: Private residence Living Arrangements: Parent (lives with his mother)               Additional  Comments: unable to get any further info on home set up    Prior Function                 Hand Dominance        Extremity/Trunk Assessment   Upper Extremity Assessment Upper Extremity Assessment: Defer to OT evaluation    Lower Extremity Assessment Lower Extremity Assessment: Overall WFL for tasks assessed       Communication   Communication: Expressive difficulties  Cognition Arousal/Alertness: Awake/alert Behavior During Therapy: Restless;Impulsive Overall Cognitive Status: History of cognitive impairments - at baseline                                 General Comments: Fluctuated between joking around and being silly with therapist to then appearing upset.      General Comments      Exercises     Assessment/Plan    PT Assessment Patient needs continued PT services  PT Problem List Decreased activity tolerance;Decreased cognition;Decreased mobility;Decreased safety awareness  PT Treatment Interventions DME instruction;Functional mobility training;Therapeutic activities;Therapeutic exercise;Patient/family education    PT Goals (Current goals can be found in the Care Plan section)  Acute Rehab PT Goals Patient Stated Goal: stay in bed PT Goal Formulation: Patient unable to participate in goal setting Time For Goal Achievement: 08/02/20 Potential to Achieve Goals: Fair    Frequency Min 2X/week   Barriers to discharge        Co-evaluation PT/OT/SLP Co-Evaluation/Treatment: Yes Reason for Co-Treatment: For patient/therapist safety;Necessary to address cognition/behavior during functional activity PT goals addressed during session: Mobility/safety with mobility         AM-PAC PT "6 Clicks" Mobility  Outcome Measure Help needed turning from your back to your side while in a flat bed without using bedrails?: None Help needed moving from lying on your back to sitting on the side of a flat bed without using bedrails?: None Help  needed moving to and from a bed to a chair (including a wheelchair)?: A Little Help needed standing up from a chair using your arms (e.g., wheelchair or bedside chair)?: A Little Help needed to walk in hospital room?: A Lot Help needed climbing 3-5 steps with a railing? : A Lot 6 Click Score: 18    End of Session   Activity Tolerance: Patient tolerated treatment well Patient left: in bed;with bed alarm set;Other (comment) (with mittens) Nurse Communication: Mobility status PT Visit Diagnosis: Other abnormalities of gait and mobility (R26.89)    Time: 1455-1520 PT Time Calculation (min) (ACUTE ONLY): 25 min   Charges:   PT Evaluation $PT Eval Low Complexity: 1 Low          Wonder Donaway L. Katrinka Blazing,  Pager [phone] 07/19/2020   Enzo Montgomery 07/19/2020, 4:03 PM

## 2020-07-19 NOTE — Progress Notes (Addendum)
PROGRESS NOTE    Alan Owens  YQI:[id number] DOB: 28-Jun-1981 DOA: 07/15/2020 PCP: Jackie Plum, MD    No chief complaint on file.   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  Patient also noted to have poor urine output.  Patient also noted to have some myoclonic jerks with cough.  Patient unvaccinated.  Patient taking care of by his mother who is primary caretaker.  Patient seen in the ED noted to have a fever with a temp of 1016, chest x-ray with bilateral infiltrates, positive Covid test, labs significant for creatinine of 4.02, bicarb of 21, normal lactic acid level.  CT renal stone protocol negative for urinary obstruction however showed bilateral infiltrates concerning for pneumonia.  Urinalysis done consistent with UTI.  Patient noted not to be hypoxic.  Patient placed on IV remdesivir, IV Rocephin, IV Solu-Medrol, IV fluids.  Supportive care.   Assessment & Plan:   Principal Problem:   ARF (acute renal failure) (HCC) Active Problems:   Congestive heart failure (HCC)   Cognitive impairment   Pneumonia due to COVID-19 virus   Hypertension  1 acute renal failure Likely secondary to prerenal azotemia due to GI losses from nausea, poor oral intake in the setting of diuretics and ACE inhibitor.  Urinalysis done also concerning for UTI.  CT renal stone protocol negative for urinary obstruction.  Patient noted not to be on any NSAIDs.  Urine sodium noted at 46, urine creatinine at 321.85.  Patient placed on IV fluids with urine output of 1.375 L over the past 24 hours.  Renal function improving creatinine trending down currently at 2.75 from 3.05 from 3.70 from 4.02 on admission.  Continue IV fluids, supportive care.  Follow renal function.  Continue  to hold ACE inhibitor and diuretics.  Avoid nephrotoxins.  Follow.  2. COVID-19 positive test (U07.1, COVID-19) with Acute Pneumonia (J12.89, Other viral pneumonia) (If respiratory failure or sepsis present, add as separate assessment Patient presented with fever, chest x-ray done concerning for bilateral infiltrates.  Patient noted not to be hypoxic.  D-dimer elevated at 2.64 and trending down currently at 1.09.  Ferritin not obtained.  CRP at 0.7 initially and trended up to 1.4 back down to < 0.5.Marland Kitchen  Procalcitonin of 0.30.  Continue IV remdesivir, IV Solu-Medrol, vitamin C, zinc, Combivent.  Supportive care.  Follow.  3.  Cognitive impairment  4.  Hypertension Controlled on amlodipine and Coreg.  ACE inhibitor and diuretics on hold secondary to acute renal failure.  5.  History of nonischemic cardiomyopathy Lasix and ACE inhibitor on hold due to acute renal failure.  On gentle hydration.  Monitor closely for volume overload.  6.  Hyperlipidemia Statin.  7.  Anemia Likely chronic in nature.  Patient with no overt bleeding.  Follow H&H.  Transfusion threshold hemoglobin < 7.  8.  UTI Urine cultures with 10,000 colonies of Proteus mirabilis.  Status post 3 days IV Rocephin.  No further antibiotics needed.   9.  Hypokalemia Replete.   DVT prophylaxis: Heparin Code Status: Full Family Communication: No family at bedside.  Disposition:   Status is: Inpatient    Dispo: The patient is from: Home              Anticipated d/c is to: Home  Anticipated d/c date is: 07/21/2020              Patient currently on IV remdesivir, IV antibiotics, not stable for discharge.       Consultants:   None  Procedures:   CT renal stone protocol 07/16/2020  Chest x-ray 07/15/2020  Antimicrobials:   IV Rocephin 07/16/2020>>>> 07/18/2020  IV remdesivir 07/16/2020>>>> 07/21/2020   Subjective: Patient laying in bed with mittens on.  Denies chest pain or shortness of breath.  No family  at bedside.   Objective: Vitals:   07/18/20 2057 07/19/20 0446 07/19/20 0447 07/19/20 1406  BP: 124/86 133/88  (!) 135/95  Pulse: 74 70  73  Resp: 20 16  15   Temp: 97.9 F (36.6 C) 97.9 F (36.6 C)  98.1 F (36.7 C)  TempSrc: Oral Oral    SpO2: 94% 98%  97%  Weight:   101.8 kg   Height:        Intake/Output Summary (Last 24 hours) at 07/19/2020 1540 Last data filed at 07/19/2020 1300 Gross per 24 hour  Intake 2352.24 ml  Output 2025 ml  Net 327.24 ml   Filed Weights   07/17/20 0500 07/18/20 0500 07/19/20 0447  Weight: 108.2 kg 99.7 kg 101.8 kg    Examination:  General exam: NAD Respiratory system: CTA B anterior lung fields.  No wheezes, no crackles, no rhonchi.  Normal respiratory effort. Cardiovascular system: RRR no murmurs rubs or gallops.  No JVD.  No lower extremity edema.   Gastrointestinal system: Abdomen is soft, nontender, nondistended, positive bowel sounds.  No rebound.  No guarding.  Central nervous system: Alert. No focal neurological deficits. Extremities: Symmetric 5 x 5 power. Skin: No rashes, lesions or ulcers Psychiatry: Judgement and insight appear poor. Mood & affect appropriate.     Data Reviewed: I have personally reviewed following labs and imaging studies  CBC: Recent Labs  Lab 07/15/20 2036 07/16/20 0340 07/17/20 0455 07/18/20 0432 07/19/20 0423  WBC 4.2 4.5 4.0 8.7 7.8  NEUTROABS 2.2  --  2.9 6.8 6.3  HGB 11.3* 10.6* 10.9* 11.4* 11.0*  HCT 32.6* 31.3* 33.7* 33.7* 32.9*  MCV 90.6 91.5 96.0 91.3 90.9  PLT 166 167 184 212 209    Basic Metabolic Panel: Recent Labs  Lab 07/15/20 2036 07/16/20 0242 07/17/20 0455 07/18/20 0432 07/19/20 0423  NA 136  --  139 142 144  K 3.6  --  4.3 3.9 3.0*  CL 104  --  112* 107 100  CO2 21*  --  18* 24 31  GLUCOSE 103*  --  136* 145* 139*  BUN 40*  --  58* 65* 70*  CREATININE 4.02* 3.95* 3.70* 3.05* 2.75*  CALCIUM 8.6*  --  8.3* 8.4* 7.8*  MG  --   --   --   --  2.1    GFR: Estimated  Creatinine Clearance: 40.2 mL/min (A) (by C-G formula based on SCr of 2.75 mg/dL (H)).  Liver Function Tests: Recent Labs  Lab 07/15/20 2036 07/17/20 0455 07/18/20 0432 07/19/20 0423  AST 30 31 29 29   ALT 22 19 20 19   ALKPHOS 79 67 65 65  BILITOT 0.9 0.7 0.7 0.6  PROT 7.7 6.6 6.7 6.3*  ALBUMIN 3.8 3.3* 3.2* 3.0*    CBG: No results for input(s): GLUCAP in the last 168 hours.   Recent Results (from the past 240 hour(s))  Culture, Urine     Status: Abnormal   Collection Time: 07/16/20 12:55 PM  Specimen: Urine, Catheterized  Result Value Ref Range Status   Specimen Description   Final    URINE, CATHETERIZED Performed at [facility], [address] [place], [place] [address]    Special Requests   Final    NONE Performed at [facility], [address], [place], [place] [address]    Culture 10,000 COLONIES/mL PROTEUS MIRABILIS (A)  Final   Report Status 07/19/2020 FINAL  Final   Organism ID, Bacteria PROTEUS MIRABILIS (A)  Final      Susceptibility   Proteus mirabilis - MIC*    AMPICILLIN <=2 SENSITIVE Sensitive     CEFAZOLIN <=4 SENSITIVE Sensitive     CEFEPIME <=0.12 SENSITIVE Sensitive     CEFTRIAXONE <=0.25 SENSITIVE Sensitive     CIPROFLOXACIN <=0.25 SENSITIVE Sensitive     GENTAMICIN <=1 SENSITIVE Sensitive     IMIPENEM 1 SENSITIVE Sensitive     NITROFURANTOIN RESISTANT Resistant     TRIMETH/SULFA <=20 SENSITIVE Sensitive     AMPICILLIN/SULBACTAM <=2 SENSITIVE Sensitive     PIP/TAZO <=4 SENSITIVE Sensitive     * 10,000 COLONIES/mL PROTEUS MIRABILIS         Radiology Studies: No results found.      Scheduled Meds: . amLODipine  2.5 mg Oral Daily  . vitamin C  500 mg Oral Daily  . atorvastatin  20 mg Oral q1800  . brimonidine  1 drop Both Eyes TID  . carvedilol  6.25 mg Oral BID WC  . enoxaparin (LOVENOX) injection  50 mg Subcutaneous Q24H  . hydrOXYzine  50 mg Oral QHS  . Ipratropium-Albuterol  2 puff  Inhalation BID  . latanoprost  1 drop Both Eyes QHS  . methylPREDNISolone (SOLU-MEDROL) injection  62.5 mg Intravenous Daily  . pantoprazole  40 mg Oral Daily  . zinc sulfate  220 mg Oral Daily   Continuous Infusions: . sodium chloride 100 mL/hr at 07/19/20 1025  . remdesivir 100 mg in NS 100 mL 100 mg (07/19/20 1021)     LOS: 4 days    Time spent: 35 minutes    Ramiro Harvest, MD [facility]   To contact the attending provider between 7A-7P or the covering provider during after hours 7P-7A, please log into the web site [email or web address] and access using universal [place] password for that web site. If you do not have the password, please call the hospital operator.  07/19/2020, 3:40 PM

## 2020-07-20 DIAGNOSIS — I509 Heart failure, unspecified: Secondary | ICD-10-CM | POA: Diagnosis not present

## 2020-07-20 DIAGNOSIS — N179 Acute kidney failure, unspecified: Secondary | ICD-10-CM | POA: Diagnosis not present

## 2020-07-20 DIAGNOSIS — U071 COVID-19: Secondary | ICD-10-CM | POA: Diagnosis not present

## 2020-07-20 DIAGNOSIS — R4189 Other symptoms and signs involving cognitive functions and awareness: Secondary | ICD-10-CM | POA: Diagnosis not present

## 2020-07-20 LAB — CBC WITH DIFFERENTIAL/PLATELET
Abs Immature Granulocytes: 0.13 10*3/uL — ABNORMAL HIGH (ref 0.00–0.07)
Basophils Absolute: 0 10*3/uL (ref 0.0–0.1)
Basophils Relative: 0 %
Eosinophils Absolute: 0 10*3/uL (ref 0.0–0.5)
Eosinophils Relative: 0 %
HCT: 33.1 % — ABNORMAL LOW (ref 39.0–52.0)
Hemoglobin: 11.1 g/dL — ABNORMAL LOW (ref 13.0–17.0)
Immature Granulocytes: 2 %
Lymphocytes Relative: 12 %
Lymphs Abs: 0.7 10*3/uL (ref 0.7–4.0)
MCH: 30.8 pg (ref 26.0–34.0)
MCHC: 33.5 g/dL (ref 30.0–36.0)
MCV: 91.9 fL (ref 80.0–100.0)
Monocytes Absolute: 0.8 10*3/uL (ref 0.1–1.0)
Monocytes Relative: 12 %
Neutro Abs: 4.5 10*3/uL (ref 1.7–7.7)
Neutrophils Relative %: 74 %
Platelets: 217 10*3/uL (ref 150–400)
RBC: 3.6 MIL/uL — ABNORMAL LOW (ref 4.22–5.81)
RDW: 13.9 % (ref 11.5–15.5)
WBC: 6.2 10*3/uL (ref 4.0–10.5)
nRBC: 0 % (ref 0.0–0.2)

## 2020-07-20 LAB — COMPREHENSIVE METABOLIC PANEL
ALT: 19 U/L (ref 0–44)
AST: 29 U/L (ref 15–41)
Albumin: 3.2 g/dL — ABNORMAL LOW (ref 3.5–5.0)
Alkaline Phosphatase: 62 U/L (ref 38–126)
Anion gap: 8 (ref 5–15)
BUN: 66 mg/dL — ABNORMAL HIGH (ref 6–20)
CO2: 31 mmol/L (ref 22–32)
Calcium: 7.7 mg/dL — ABNORMAL LOW (ref 8.9–10.3)
Chloride: 102 mmol/L (ref 98–111)
Creatinine, Ser: 2.34 mg/dL — ABNORMAL HIGH (ref 0.61–1.24)
GFR, Estimated: 35 mL/min — ABNORMAL LOW (ref >60)
Glucose, Bld: 140 mg/dL — ABNORMAL HIGH (ref 70–99)
Potassium: 3.2 mmol/L — ABNORMAL LOW (ref 3.5–5.1)
Sodium: 141 mmol/L (ref 135–145)
Total Bilirubin: 1.2 mg/dL (ref 0.3–1.2)
Total Protein: 6.2 g/dL — ABNORMAL LOW (ref 6.5–8.1)

## 2020-07-20 LAB — C-REACTIVE PROTEIN: CRP: <0.5 mg/dL (ref <1.0)

## 2020-07-20 LAB — D-DIMER, QUANTITATIVE: D-Dimer, Quant: 1.12 ug/mL-FEU — ABNORMAL HIGH (ref 0.00–0.50)

## 2020-07-20 MED ORDER — POTASSIUM CHLORIDE CRYS ER 20 MEQ PO TBCR
40.0000 meq | EXTENDED_RELEASE_TABLET | Freq: Once | ORAL | Status: AC
  Administered 2020-07-20: 40 meq via ORAL
  Filled 2020-07-20: qty 2

## 2020-07-20 MED ORDER — HYDROXYZINE HCL 50 MG/ML IM SOLN
50.0000 mg | Freq: Four times a day (QID) | INTRAMUSCULAR | Status: AC | PRN
  Administered 2020-07-20 (×2): 50 mg via INTRAMUSCULAR
  Filled 2020-07-20 (×3): qty 1

## 2020-07-20 NOTE — Progress Notes (Signed)
Pts tongue appears less swollen than previously noted and protruding less. Pt is very restless, flopping all over the bed, banging on bed rail, yelling out. Family called and pt spoke to brother and mother on phone several times but family unable to stay with him at this time. Tussionex given for cough and tylenol given for discomfort with no improvement. Pt toileted, cleaned up and offered water. Pt denies any concerns when staff enters room. Linton Flemings NP notified and new orders given and implemented. Will continue to monitor.

## 2020-07-20 NOTE — Progress Notes (Addendum)
PROGRESS NOTE    Alan Owens  VZD:[id number] DOB: 1981/05/31 DOA: 07/15/2020 PCP: Jackie Plum, MD    No chief complaint on file.   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  Patient also noted to have poor urine output.  Patient also noted to have some myoclonic jerks with cough.  Patient unvaccinated.  Patient taking care of by his mother who is primary caretaker.  Patient seen in the ED noted to have a fever with a temp of 1016, chest x-ray with bilateral infiltrates, positive Covid test, labs significant for creatinine of 4.02, bicarb of 21, normal lactic acid level.  CT renal stone protocol negative for urinary obstruction however showed bilateral infiltrates concerning for pneumonia.  Urinalysis done consistent with UTI.  Patient noted not to be hypoxic.  Patient placed on IV remdesivir, IV Rocephin, IV Solu-Medrol, IV fluids.  Supportive care.   Assessment & Plan:   Principal Problem:   ARF (acute renal failure) (HCC) Active Problems:   Congestive heart failure (HCC)   Cognitive impairment   Pneumonia due to COVID-19 virus   Hypertension  1 acute renal failure Likely secondary to prerenal azotemia due to GI losses from nausea, poor oral intake in the setting of diuretics and ACE inhibitor.  Urinalysis done also concerning for UTI.  CT renal stone protocol negative for urinary obstruction.  Patient noted not to be on any NSAIDs.  Urine sodium noted at 46, urine creatinine at 321.85.  Patient placed on IV fluids with urine output of 1.6 L over the past 24 hours.  Renal function improving creatinine trending down currently at 2.34 from 2.75 from 3.05 from 3.70 from 4.02 on admission.  Saline lock IV fluids.  Continue to hold ACE inhibitor and  diuretics.  Will likely not resume ACE inhibitor on discharge.  Avoid nephrotoxins.  Follow.    2. COVID-19 positive test (U07.1, COVID-19) with Acute Pneumonia (J12.89, Other viral pneumonia) (If respiratory failure or sepsis present, add as separate assessment Patient presented with fever, chest x-ray done concerning for bilateral infiltrates.  Patient noted not to be hypoxic.  D-dimer elevated at 2.64 and trending down currently at 1.12.  Ferritin not obtained.  CRP at 0.7 initially and trended up to 1.4 back down to < 0.5.Marland Kitchen  Procalcitonin of 0.30.  Continue IV remdesivir, IV Solu-Medrol, vitamin C, zinc, Combivent.  Supportive care.  Saline lock IV fluids.  Follow.  3.  Cognitive impairment  4.  Hypertension Controlled on amlodipine and Coreg.  ACE inhibitor and diuretics on hold secondary to acute renal failure.  5.  History of nonischemic cardiomyopathy Lasix and ACE inhibitor on hold due to acute renal failure.  On gentle hydration.  Will likely not resume ACE inhibitor on discharge.  Saline lock IV fluids.    6.  Hyperlipidemia Statin.  7.  Anemia Likely chronic in nature.  Patient with no overt bleeding.  Follow H&H.  Transfusion threshold hemoglobin < 7.  8.  UTI Urine cultures with 10,000 colonies of Proteus mirabilis.  Status post 3 days IV Rocephin.   9.  Hypokalemia K. Dur 40 mEq p.o. x1.     DVT prophylaxis: Heparin Code Status: Full Family Communication: Updated mother on the telephone. Disposition:   Status is: Inpatient    Dispo: The patient is from: Home  Anticipated d/c is to: Home              Anticipated d/c date is: 07/21/2020-07/22/2020              Patient currently on IV remdesivir, IV antibiotics, not stable for discharge.       Consultants:   None  Procedures:   CT renal stone protocol 07/16/2020  Chest x-ray 07/15/2020  Antimicrobials:   IV Rocephin 07/16/2020>>>> 07/18/2020  IV remdesivir 07/16/2020>>>>  07/21/2020   Subjective: Patient laying in bed.  Denies chest pain or shortness of breath.  Events overnight noted.     Objective: Vitals:   07/19/20 2114 07/20/20 0446 07/20/20 0600 07/20/20 1100  BP: 128/90 114/70    Pulse: 80 65    Resp: 17 (!) 21    Temp: 97.9 F (36.6 C) 97.8 F (36.6 C)    TempSrc: Oral Oral    SpO2: 96% 94%    Weight:   99 kg 100 kg  Height:        Intake/Output Summary (Last 24 hours) at 07/20/2020 1208 Last data filed at 07/20/2020 0300 Gross per 24 hour  Intake 1193.8 ml  Output 600 ml  Net 593.8 ml   Filed Weights   07/19/20 0447 07/20/20 0600 07/20/20 1100  Weight: 101.8 kg 99 kg 100 kg    Examination:  General exam: NAD Respiratory system: Lungs clear to auscultation bilaterally anterior lung fields.  No wheezes, no crackles, no rhonchi.  Normal respiratory effort.  Cardiovascular system: Regular rate rhythm no murmurs rubs or gallops.  No JVD.  No lower extremity edema.  Gastrointestinal system: Abdomen is soft, nontender, nondistended, positive bowel sounds.  No rebound.  No guarding. Central nervous system: Alert. No focal neurological deficits. Extremities: Symmetric 5 x 5 power. Skin: No rashes, lesions or ulcers Psychiatry: Judgement and insight appear poor. Mood & affect appropriate.     Data Reviewed: I have personally reviewed following labs and imaging studies  CBC: Recent Labs  Lab 07/15/20 2036 07/16/20 0340 07/17/20 0455 07/18/20 0432 07/19/20 0423 07/20/20 0811  WBC 4.2 4.5 4.0 8.7 7.8 6.2  NEUTROABS 2.2  --  2.9 6.8 6.3 4.5  HGB 11.3* 10.6* 10.9* 11.4* 11.0* 11.1*  HCT 32.6* 31.3* 33.7* 33.7* 32.9* 33.1*  MCV 90.6 91.5 96.0 91.3 90.9 91.9  PLT 166 167 184 212 209 217    Basic Metabolic Panel: Recent Labs  Lab 07/15/20 2036 07/16/20 0242 07/17/20 0455 07/18/20 0432 07/19/20 0423 07/20/20 0811  NA 136  --  139 142 144 141  K 3.6  --  4.3 3.9 3.0* 3.2*  CL 104  --  112* 107 100 102  CO2 21*  --  18* 24  31 31   GLUCOSE 103*  --  136* 145* 139* 140*  BUN 40*  --  58* 65* 70* 66*  CREATININE 4.02* 3.95* 3.70* 3.05* 2.75* 2.34*  CALCIUM 8.6*  --  8.3* 8.4* 7.8* 7.7*  MG  --   --   --   --  2.1  --     GFR: Estimated Creatinine Clearance: 46.8 mL/min (A) (by C-G formula based on SCr of 2.34 mg/dL (H)).  Liver Function Tests: Recent Labs  Lab 07/15/20 2036 07/17/20 0455 07/18/20 0432 07/19/20 0423 07/20/20 0811  AST 30 31 29 29 29   ALT 22 19 20 19 19   ALKPHOS 79 67 65 65 62  BILITOT 0.9 0.7 0.7 0.6 1.2  PROT 7.7 6.6 6.7 6.3* 6.2*  ALBUMIN 3.8 3.3* 3.2* 3.0* 3.2*    CBG: No results for input(s): GLUCAP in the last 168 hours.   Recent Results (from the past 240 hour(s))  Culture, Urine     Status: Abnormal   Collection Time: 07/16/20 12:55 PM   Specimen: Urine, Catheterized  Result Value Ref Range Status   Specimen Description   Final    URINE, CATHETERIZED Performed at [facility], [address], [place], [place] [address]    Special Requests   Final    NONE Performed at [facility], [address] [place], [place] [address]    Culture 10,000 COLONIES/mL PROTEUS MIRABILIS (A)  Final   Report Status 07/19/2020 FINAL  Final   Organism ID, Bacteria PROTEUS MIRABILIS (A)  Final      Susceptibility   Proteus mirabilis - MIC*    AMPICILLIN <=2 SENSITIVE Sensitive     CEFAZOLIN <=4 SENSITIVE Sensitive     CEFEPIME <=0.12 SENSITIVE Sensitive     CEFTRIAXONE <=0.25 SENSITIVE Sensitive     CIPROFLOXACIN <=0.25 SENSITIVE Sensitive     GENTAMICIN <=1 SENSITIVE Sensitive     IMIPENEM 1 SENSITIVE Sensitive     NITROFURANTOIN RESISTANT Resistant     TRIMETH/SULFA <=20 SENSITIVE Sensitive     AMPICILLIN/SULBACTAM <=2 SENSITIVE Sensitive     PIP/TAZO <=4 SENSITIVE Sensitive     * 10,000 COLONIES/mL PROTEUS MIRABILIS         Radiology Studies: No results found.      Scheduled Meds: . amLODipine  2.5 mg Oral Daily  . vitamin  C  500 mg Oral Daily  . atorvastatin  20 mg Oral q1800  . brimonidine  1 drop Both Eyes TID  . carvedilol  6.25 mg Oral BID WC  . enoxaparin (LOVENOX) injection  50 mg Subcutaneous Q24H  . hydrOXYzine  50 mg Oral QHS  . Ipratropium-Albuterol  2 puff Inhalation BID  . latanoprost  1 drop Both Eyes QHS  . methylPREDNISolone (SOLU-MEDROL) injection  62.5 mg Intravenous Daily  . pantoprazole  40 mg Oral Daily  . potassium chloride  40 mEq Oral Once  . zinc sulfate  220 mg Oral Daily   Continuous Infusions:    LOS: 5 days    Time spent: 35 minutes    Ramiro Harvest, MD [facility]   To contact the attending provider between 7A-7P or the covering provider during after hours 7P-7A, please log into the web site [email or web address] and access using universal [place] password for that web site. If you do not have the password, please call the hospital operator.  07/20/2020, 12:08 PM

## 2020-07-21 DIAGNOSIS — I10 Essential (primary) hypertension: Secondary | ICD-10-CM | POA: Diagnosis not present

## 2020-07-21 DIAGNOSIS — N179 Acute kidney failure, unspecified: Secondary | ICD-10-CM | POA: Diagnosis not present

## 2020-07-21 DIAGNOSIS — I509 Heart failure, unspecified: Secondary | ICD-10-CM | POA: Diagnosis not present

## 2020-07-21 DIAGNOSIS — R4189 Other symptoms and signs involving cognitive functions and awareness: Secondary | ICD-10-CM | POA: Diagnosis not present

## 2020-07-21 LAB — CBC WITH DIFFERENTIAL/PLATELET
Abs Immature Granulocytes: 0.34 10*3/uL — ABNORMAL HIGH (ref 0.00–0.07)
Basophils Absolute: 0 10*3/uL (ref 0.0–0.1)
Basophils Relative: 0 %
Eosinophils Absolute: 0 10*3/uL (ref 0.0–0.5)
Eosinophils Relative: 0 %
HCT: 34.5 % — ABNORMAL LOW (ref 39.0–52.0)
Hemoglobin: 11.4 g/dL — ABNORMAL LOW (ref 13.0–17.0)
Immature Granulocytes: 5 %
Lymphocytes Relative: 14 %
Lymphs Abs: 0.9 10*3/uL (ref 0.7–4.0)
MCH: 30.9 pg (ref 26.0–34.0)
MCHC: 33 g/dL (ref 30.0–36.0)
MCV: 93.5 fL (ref 80.0–100.0)
Monocytes Absolute: 0.8 10*3/uL (ref 0.1–1.0)
Monocytes Relative: 11 %
Neutro Abs: 4.8 10*3/uL (ref 1.7–7.7)
Neutrophils Relative %: 70 %
Platelets: 258 10*3/uL (ref 150–400)
RBC: 3.69 MIL/uL — ABNORMAL LOW (ref 4.22–5.81)
RDW: 14.1 % (ref 11.5–15.5)
WBC: 6.8 10*3/uL (ref 4.0–10.5)
nRBC: 0.3 % — ABNORMAL HIGH (ref 0.0–0.2)

## 2020-07-21 LAB — COMPREHENSIVE METABOLIC PANEL
ALT: 21 U/L (ref 0–44)
AST: 38 U/L (ref 15–41)
Albumin: 3.4 g/dL — ABNORMAL LOW (ref 3.5–5.0)
Alkaline Phosphatase: 62 U/L (ref 38–126)
Anion gap: 10 (ref 5–15)
BUN: 54 mg/dL — ABNORMAL HIGH (ref 6–20)
CO2: 29 mmol/L (ref 22–32)
Calcium: 8.8 mg/dL — ABNORMAL LOW (ref 8.9–10.3)
Chloride: 107 mmol/L (ref 98–111)
Creatinine, Ser: 2.04 mg/dL — ABNORMAL HIGH (ref 0.61–1.24)
GFR, Estimated: 41 mL/min — ABNORMAL LOW (ref >60)
Glucose, Bld: 154 mg/dL — ABNORMAL HIGH (ref 70–99)
Potassium: 3.8 mmol/L (ref 3.5–5.1)
Sodium: 146 mmol/L — ABNORMAL HIGH (ref 135–145)
Total Bilirubin: 1.2 mg/dL (ref 0.3–1.2)
Total Protein: 6.8 g/dL (ref 6.5–8.1)

## 2020-07-21 LAB — C-REACTIVE PROTEIN: CRP: <0.5 mg/dL (ref <1.0)

## 2020-07-21 LAB — D-DIMER, QUANTITATIVE: D-Dimer, Quant: 1.25 ug/mL-FEU — ABNORMAL HIGH (ref 0.00–0.50)

## 2020-07-21 MED ORDER — ASPIRIN EC 81 MG PO TBEC: 81.0000 mg | DELAYED_RELEASE_TABLET | Freq: Every day | ORAL | 0 refills | Status: AC

## 2020-07-21 MED ORDER — POTASSIUM CHLORIDE CRYS ER 20 MEQ PO TBCR
40.0000 meq | EXTENDED_RELEASE_TABLET | Freq: Once | ORAL | Status: AC
  Administered 2020-07-21: 40 meq via ORAL
  Filled 2020-07-21: qty 2

## 2020-07-21 MED ORDER — PREDNISONE 20 MG PO TABS: ORAL_TABLET | ORAL | 0 refills | Status: AC

## 2020-07-21 MED ORDER — FUROSEMIDE 20 MG PO TABS: 20.0000 mg | ORAL_TABLET | Freq: Every day | ORAL | 3 refills | Status: DC

## 2020-07-21 MED ORDER — GUAIFENESIN-DM 100-10 MG/5ML PO SYRP: 10.0000 mL | ORAL_SOLUTION | ORAL | 0 refills | Status: DC | PRN

## 2020-07-21 MED ORDER — IPRATROPIUM-ALBUTEROL 20-100 MCG/ACT IN AERS: 2.0000 | INHALATION_SPRAY | Freq: Two times a day (BID) | RESPIRATORY_TRACT | 1 refills | Status: AC

## 2020-07-21 MED ORDER — FUROSEMIDE 20 MG PO TABS
20.0000 mg | ORAL_TABLET | Freq: Every day | ORAL | Status: DC
Start: 2020-07-21 — End: 2020-07-21
  Administered 2020-07-21: 20 mg via ORAL
  Filled 2020-07-21: qty 1

## 2020-07-21 MED ORDER — ZINC SULFATE 220 (50 ZN) MG PO CAPS: 220.0000 mg | ORAL_CAPSULE | Freq: Every day | ORAL | Status: DC

## 2020-07-21 MED ORDER — POTASSIUM CHLORIDE CRYS ER 20 MEQ PO TBCR: 20.0000 meq | EXTENDED_RELEASE_TABLET | Freq: Every day | ORAL | 3 refills | Status: DC

## 2020-07-21 MED ORDER — ASCORBIC ACID 500 MG PO TABS: 500.0000 mg | ORAL_TABLET | Freq: Every day | ORAL | Status: AC

## 2020-07-21 NOTE — Progress Notes (Signed)
Pt very restless and agitated tonight. Pt banging on siderails, yelling out and pulled out IV. Pt denies any concerns when staff enters the room. Pt spoke to mother on phone, Tylenol and Vistaril given. Pt still restless and agitated. Linton Flemings NP notified and states it is ok to leave IV out.

## 2020-07-21 NOTE — Discharge Summary (Signed)
Physician Discharge Summary  Alan Owens [facility]:[id number] DOB: [birth year] DOA: 07/15/2020  PCP: Jackie Plum, MD  Admit date: 07/15/2020 Discharge date: 07/21/2020  Time spent: 55 minutes  Recommendations for Outpatient Follow-up:  1. Follow-up with Jackie Plum, MD 1 week post discharge.  On follow-up patient will need a basic metabolic profile done to follow-up on electrolytes and renal function.  Patient's ACE inhibitor has been discontinued on discharge.   Discharge Diagnoses:  Principal Problem:   ARF (acute renal failure) (HCC) Active Problems:   Congestive heart failure (HCC)   Cognitive impairment   Pneumonia due to COVID-19 virus   Hypertension   Discharge Condition: Stable and improved  Diet recommendation: Heart healthy  Filed Weights   07/19/20 0447 07/20/20 0600 07/20/20 1100  Weight: 101.8 kg 99 kg 100 kg    History of present illness:  HPI per Dr. Gus Rankin is a 40 y.o. male with known history of cognitive impairment, at baseline noncommunicative with partial blindness, nonischemic cardiomyopathy last EF measured was around 45 to 50%, hypertension hyperlipidemia was brought to the ER after patient was found to have poor appetite with vomiting over the last 24 hours.  Has not made much urine.  Also has been noticed to have some myoclonic jerks and cough.  Did not notice any fever chills or diarrhea.  Has not received any COVID-19 vaccine.  Patient is largely taken care of by patient's mother.  About 3 weeks ago patient had ran out of Lasix and was restarted again with 20 mg.  ED Course: In the ER patient had temperature 101.6 F with chest x-ray showing bilateral infiltrates but patient was not hypoxic Covid test came back positive.  Labs are significant for creatinine of 4.02 with bicarb of 21 potassium was normal lactic acid was normal.  Hemoglobin is at baseline around 11.3.  Patient's creatinine has increased 1.24 in June 2021 and  it is around 4.02.  Patient does take lisinopril and it was recently restarted on Lasix.  CT renal study does not show any urinary obstruction but does show bilateral infiltrates concerning for pneumonia.  Hospital Course:  1 acute renal failure Likely secondary to prerenal azotemia due to GI losses from nausea, poor oral intake in the setting of diuretics and ACE inhibitor.  Urinalysis done also concerning for UTI.  CT renal stone protocol negative for urinary obstruction.  Patient noted not to be on any NSAIDs.  Urine sodium noted at 46, urine creatinine at 321.85.  Patient placed on IV fluids with good urine output and improvement with renal function such that by day of discharge creatinine was down to 2.04 from 4.02 by day of discharge.  ACE inhibitor and diuretics were held initially during the hospitalization.  Diuretics will be resumed 3 to 4 days post discharge.  ACE inhibitor will not be resumed on discharge.  Outpatient follow-up with PCP.     2. COVID-19 positive test (U07.1, COVID-19) with Acute Pneumonia (J12.89, Other viral pneumonia) (If respiratory failure or sepsis present, add as separate assessment Patient presented with fever, chest x-ray done concerning for bilateral infiltrates.  Patient noted not to be hypoxic.  D-dimer elevated at 2.64 and trended down to 1.25 by day of discharge.  CRP trended down and was < 0.5.  Procalcitonin noted at 0.3.  Patient received a full course of IV remdesivir.  Patient was on IV Solu-Medrol by discharge on a prednisone taper as well as vitamin C and zinc.  Patient also placed  on Combivent.  Patient improved clinically and was on room air with sats of 100% by day of discharge.  Patient was discharged home with 4 more days of quarantine to complete a 10-day course of a quarantine from day patient first tested positive for COVID-19.  Patient is to follow-up with PCP 1 week post discharge.    3.  Cognitive impairment  4.  Hypertension Controlled on  amlodipine and Coreg.  ACE inhibitor and diuretics were held during the hospitalization.  Diuretics will be resumed 3 to 4 days post discharge.  ACE inhibitor has been discontinued.   5.  History of nonischemic cardiomyopathy Lasix and ACE inhibitor were held during the hospitalization secondary to acute renal failure.  Patient hydrated gently with IV fluids.  ACE inhibitor not resumed and will not be resumed on discharge.  Patient is to resume Lasix 3 to 4 days post discharge.    6.  Hyperlipidemia Patient maintained on statin.  7.  Anemia Likely chronic in nature.  Patient with no overt bleeding.    Hemoglobin remained stable at 11.4.  Outpatient follow-up.    8.  UTI Urine cultures with 10,000 colonies of Proteus mirabilis.  Status post 3 days IV Rocephin.   No further antibiotics needed.  9.  Hypokalemia Repleted.  Outpatient follow-up.    Procedures:  CT renal stone protocol 07/16/2020  Chest x-ray 07/15/2020    Consultations:  None  Discharge Exam: Vitals:   07/20/20 2021 07/21/20 1255  BP: 130/87 (!) 140/101  Pulse: 72 74  Resp: (!) 23 20  Temp: 98.7 F (37.1 C) 98.9 F (37.2 C)  SpO2: 94% 100%    General: NAD Cardiovascular: RRR Respiratory: CTAB  Discharge Instructions   Discharge Instructions    Diet - low sodium heart healthy   Complete by: As directed    Discharge instructions   Complete by: As directed    Please quarantine for 4 more days for a total opf 10 days through 07/25/2020  ?   Person Under Monitoring Name: Alan Owens  Location: [address] [place] [address]   Infection Prevention Recommendations for Individuals Confirmed to have, or Being Evaluated for, 2019 Novel Coronavirus (COVID-19) Infection Who Receive Care at Home  Individuals who are confirmed to have, or are being evaluated for, COVID-19 should follow the prevention steps below until a healthcare provider or local or state health department says they  can return to normal activities.  Stay home except to get medical care You should restrict activities outside your home, except for getting medical care. Do not go to work, school, or public areas, and do not use public transportation or taxis.  Call ahead before visiting your doctor Before your medical appointment, call the healthcare provider and tell them that you have, or are being evaluated for, COVID-19 infection. This will help the healthcare provider's office take steps to keep other people from getting infected. Ask your healthcare provider to call the local or state health department.  Monitor your symptoms Seek prompt medical attention if your illness is worsening (e.g., difficulty breathing). Before going to your medical appointment, call the healthcare provider and tell them that you have, or are being evaluated for, COVID-19 infection. Ask your healthcare provider to call the local or state health department.  Wear a facemask You should wear a facemask that covers your nose and mouth when you are in the same room with other people and when you visit a healthcare provider. People who live  with or visit you should also wear a facemask while they are in the same room with you.  Separate yourself from other people in your home As much as possible, you should stay in a different room from other people in your home. Also, you should use a separate bathroom, if available.  Avoid sharing household items You should not share dishes, drinking glasses, cups, eating utensils, towels, bedding, or other items with other people in your home. After using these items, you should wash them thoroughly with soap and water.  Cover your coughs and sneezes Cover your mouth and nose with a tissue when you cough or sneeze, or you can cough or sneeze into your sleeve. Throw used tissues in a lined trash can, and immediately wash your hands with soap and water for at least 20 seconds or use  an alcohol-based hand rub.  Wash your [employer]hands Wash your hands often and thoroughly with soap and water for at least 20 seconds. You can use an alcohol-based hand sanitizer if soap and water are not available and if your hands are not visibly dirty. Avoid touching your eyes, nose, and mouth with unwashed hands.   Prevention Steps for Caregivers and Household Members of Individuals Confirmed to have, or Being Evaluated for, COVID-19 Infection Being Cared for in the Home  If you live with, or provide care at home for, a person confirmed to have, or being evaluated for, COVID-19 infection please follow these guidelines to prevent infection:  Follow healthcare provider's instructions Make sure that you understand and can help the patient follow any healthcare provider instructions for all care.  Provide for the patient's basic needs You should help the patient with basic needs in the home and provide support for getting groceries, prescriptions, and other personal needs.  Monitor the patient's symptoms If they are getting sicker, call his or her medical provider and tell them that the patient has, or is being evaluated for, COVID-19 infection. This will help the healthcare provider's office take steps to keep other people from getting infected. Ask the healthcare provider to call the local or state health department.  Limit the number of people who have contact with the patient If possible, have only one caregiver for the patient. Other household members should stay in another home or place of residence. If this is not possible, they should stay in another room, or be separated from the patient as much as possible. Use a separate bathroom, if available. Restrict visitors who do not have an essential need to be in the home.  Keep older adults, very young children, and other sick people away from the patient Keep older adults, very young children, and those who have compromised immune  systems or chronic health conditions away from the patient. This includes people with chronic heart, lung, or kidney conditions, diabetes, and cancer.  Ensure good ventilation Make sure that shared spaces in the home have good air flow, such as from an air conditioner or an opened window, weather permitting.  Wash your hands often Wash your hands often and thoroughly with soap and water for at least 20 seconds. You can use an alcohol based hand sanitizer if soap and water are not available and if your hands are not visibly dirty. Avoid touching your eyes, nose, and mouth with unwashed hands. Use disposable paper towels to dry your hands. If not available, use dedicated cloth towels and replace them when they become wet.  Wear a facemask and gloves Wear a  disposable facemask at all times in the room and gloves when you touch or have contact with the patient's blood, body fluids, and/or secretions or excretions, such as sweat, saliva, sputum, nasal mucus, vomit, urine, or feces.  Ensure the mask fits over your nose and mouth tightly, and do not touch it during use. Throw out disposable facemasks and gloves after using them. Do not reuse. Wash your hands immediately after removing your facemask and gloves. If your personal clothing becomes contaminated, carefully remove clothing and launder. Wash your hands after handling contaminated clothing. Place all used disposable facemasks, gloves, and other waste in a lined container before disposing them with other household waste. Remove gloves and wash your hands immediately after handling these items.  Do not share dishes, glasses, or other household items with the patient Avoid sharing household items. You should not share dishes, drinking glasses, cups, eating utensils, towels, bedding, or other items with a patient who is confirmed to have, or being evaluated for, COVID-19 infection. After the person uses these items, you should wash them thoroughly  with soap and water.  Wash laundry thoroughly Immediately remove and wash clothes or bedding that have blood, body fluids, and/or secretions or excretions, such as sweat, saliva, sputum, nasal mucus, vomit, urine, or feces, on them. Wear gloves when handling laundry from the patient. Read and follow directions on labels of laundry or clothing items and detergent. In general, wash and dry with the warmest temperatures recommended on the label.  Clean all areas the individual has used often Clean all touchable surfaces, such as counters, tabletops, doorknobs, bathroom fixtures, toilets, phones, keyboards, tablets, and bedside tables, every day. Also, clean any surfaces that may have blood, body fluids, and/or secretions or excretions on them. Wear gloves when cleaning surfaces the patient has come in contact with. Use a diluted bleach solution (e.g., dilute bleach with 1 part bleach and 10 parts water) or a household disinfectant with a label that says EPA-registered for coronaviruses. To make a bleach solution at home, add 1 tablespoon of bleach to 1 quart (4 cups) of water. For a larger supply, add  cup of bleach to 1 gallon (16 cups) of water. Read labels of cleaning products and follow recommendations provided on product labels. Labels contain instructions for safe and effective use of the cleaning product including precautions you should take when applying the product, such as wearing gloves or eye protection and making sure you have good ventilation during use of the product. Remove gloves and wash hands immediately after cleaning.  Monitor yourself for signs and symptoms of illness Caregivers and household members are considered close contacts, should monitor their health, and will be asked to limit movement outside of the home to the extent possible. Follow the monitoring steps for close contacts listed on the symptom monitoring form.   ? If you have additional questions, contact your  local health department or call the epidemiologist on call at [phone] (available 24/7). ? This guidance is subject to change. For the most up-to-date guidance from [facility], please refer to their website: [email or web address][email or web address]   Increase activity slowly   Complete by: As directed      Allergies as of 07/21/2020   No Known Allergies     Medication List    STOP taking these medications   lisinopril 10 MG tablet Commonly known as: ZESTRIL     TAKE these medications   Alphagan P 0.1 % Soln Generic drug: brimonidine Apply 1 drop to eye in the  morning and at bedtime.   amLODipine 2.5 MG tablet Commonly known as: NORVASC Take 1 tablet (2.5 mg total) by mouth daily.   ascorbic acid 500 MG tablet Commonly known as: VITAMIN C Take 1 tablet (500 mg total) by mouth daily. Start taking on: July 22, 2020   Aspirin Low Dose 81 MG EC tablet Generic drug: aspirin TAKE 1 TABLET BY MOUTH EVERY DAY What changed: how much to take   aspirin EC 81 MG tablet Take 1 tablet (81 mg total) by mouth daily for 14 days. Swallow whole. What changed: You were already taking a medication with the same name, and this prescription was added. Make sure you understand how and when to take each.   atorvastatin 20 MG tablet Commonly known as: LIPITOR Take 1 tablet (20 mg total) by mouth daily at 6 PM.   carvedilol 6.25 MG tablet Commonly known as: COREG Take 1 tablet (6.25 mg total) by mouth 2 (two) times daily with a meal.   furosemide 20 MG tablet Commonly known as: LASIX Take 1 tablet (20 mg total) by mouth daily. Start taking on: July 25, 2020 What changed: These instructions start on July 25, 2020. If you are unsure what to do until then, ask your doctor or other care provider.   guaiFENesin-dextromethorphan 100-10 MG/5ML syrup Commonly known as: ROBITUSSIN DM Take 10 mLs by mouth every 4 (four) hours as needed for cough.    hydrOXYzine 50 MG tablet Commonly known as: ATARAX/VISTARIL TAKE 1 TABLET BY MOUTH AT NIGHT   Ipratropium-Albuterol 20-100 MCG/ACT Aers respimat Commonly known as: COMBIVENT Inhale 2 puffs into the lungs 2 (two) times daily. Use 2 times daily x 1 week, then as needed.   latanoprost 0.005 % ophthalmic solution Commonly known as: XALATAN Place 1 drop into both eyes at bedtime.   omeprazole 40 MG capsule Commonly known as: PRILOSEC Take 1 capsule (40 mg total) by mouth daily.   potassium chloride SA 20 MEQ tablet Commonly known as: KLOR-CON Take 1 tablet (20 mEq total) by mouth daily. Start taking on: July 25, 2020 What changed: These instructions start on July 25, 2020. If you are unsure what to do until then, ask your doctor or other care provider.   predniSONE 20 MG tablet Commonly known as: DELTASONE Take 3 tablets (60 mg total) by mouth daily with breakfast for 4 days, THEN 2 tablets (40 mg total) daily with breakfast for 4 days, THEN 1 tablet (20 mg total) daily with breakfast for 4 days. Start taking on: July 21, 2020   zinc sulfate 220 (50 Zn) MG capsule Take 1 capsule (220 mg total) by mouth daily. Start taking on: July 22, 2020      No Known Allergies  Follow-up Information    Osei-Bonsu, Greggory Stallion, MD. Schedule an appointment as soon as possible for a visit in 1 week(s).   Specialty: Internal Medicine Contact information: [address] [place] [address] [phone]        Wendall Stade, MD .   Specialty: Cardiology Contact information: [address] [place] [address] [phone]                The results of significant diagnostics from this hospitalization (including imaging, microbiology, ancillary and laboratory) are listed below for reference.    Significant Diagnostic Studies: DG Chest Port 1 View  Result Date: 07/15/2020 CLINICAL DATA:  Nausea and vomiting with fever and cough. EXAM:  PORTABLE CHEST 1  VIEW COMPARISON:  September 17, 2003 FINDINGS: Mildly increased bronchovascular lung markings are seen within the bilateral lung bases. There is no evidence of a pleural effusion or pneumothorax. The cardiac silhouette is mildly enlarged and unchanged in size. Stable scoliosis is seen involving the thoracic spine without acute osseous abnormality. IMPRESSION: Mildly increased bibasilar bronchovascular lung markings which may represent early infiltrates. Correlation with follow-up chest plain film is recommended. Electronically Signed   By: Aram Candela M.D.   On: 07/15/2020 20:57   CT Renal Stone Study  Result Date: 07/16/2020 CLINICAL DATA:  Nausea and vomiting, COVID-19 positive EXAM: CT ABDOMEN AND PELVIS WITHOUT CONTRAST TECHNIQUE: Multidetector CT imaging of the abdomen and pelvis was performed following the standard protocol without IV contrast. COMPARISON:  None. FINDINGS: Lower chest: Multifocal bibasilar ground-glass airspace disease consistent with COVID 19 pneumonia. Hepatobiliary: No focal liver abnormality is seen. No gallstones, gallbladder wall thickening, or biliary dilatation. Pancreas: Unremarkable. No pancreatic ductal dilatation or surrounding inflammatory changes. Spleen: Normal in size without focal abnormality. Adrenals/Urinary Tract: Punctate less than 2 mm nonobstructing calculus left kidney image 30/2. No other urinary tract calculi or obstructive uropathy. The bladder is unremarkable. Adrenals are normal. Stomach/Bowel: No bowel obstruction or ileus. No bowel wall thickening or inflammatory change. The appendix, if still present, is not well visualized. Vascular/Lymphatic: No significant vascular findings are present. No enlarged abdominal or pelvic lymph nodes. Reproductive: Prostate is unremarkable. Other: No free fluid or free gas.  No abdominal wall hernia. Musculoskeletal: No acute or destructive bony lesions. Reconstructed images demonstrate no additional  findings. IMPRESSION: 1. Multifocal bibasilar pneumonia consistent with COVID 19. 2. Punctate less than 2 mm nonobstructing left renal calculus. Electronically Signed   By: Sharlet Salina M.D.   On: 07/16/2020 01:07    Microbiology: Recent Results (from the past 240 hour(s))  Culture, Urine     Status: Abnormal   Collection Time: 07/16/20 12:55 PM   Specimen: Urine, Catheterized  Result Value Ref Range Status   Specimen Description   Final    URINE, CATHETERIZED Performed at [facility], [address] [place], [place] [address]    Special Requests   Final    NONE Performed at [facility], [address], [place], [place] [address]    Culture 10,000 COLONIES/mL PROTEUS MIRABILIS (A)  Final   Report Status 07/19/2020 FINAL  Final   Organism ID, Bacteria PROTEUS MIRABILIS (A)  Final      Susceptibility   Proteus mirabilis - MIC*    AMPICILLIN <=2 SENSITIVE Sensitive     CEFAZOLIN <=4 SENSITIVE Sensitive     CEFEPIME <=0.12 SENSITIVE Sensitive     CEFTRIAXONE <=0.25 SENSITIVE Sensitive     CIPROFLOXACIN <=0.25 SENSITIVE Sensitive     GENTAMICIN <=1 SENSITIVE Sensitive     IMIPENEM 1 SENSITIVE Sensitive     NITROFURANTOIN RESISTANT Resistant     TRIMETH/SULFA <=20 SENSITIVE Sensitive     AMPICILLIN/SULBACTAM <=2 SENSITIVE Sensitive     PIP/TAZO <=4 SENSITIVE Sensitive     * 10,000 COLONIES/mL PROTEUS MIRABILIS     Labs: Basic Metabolic Panel: Recent Labs  Lab 07/17/20 0455 07/18/20 0432 07/19/20 0423 07/20/20 0811 07/21/20 1033  NA 139 142 144 141 146*  K 4.3 3.9 3.0* 3.2* 3.8  CL 112* 107 100 102 107  CO2 18* 24 31 31 29   GLUCOSE 136* 145* 139* 140* 154*  BUN 58* 65* 70* 66* 54*  CREATININE 3.70* 3.05* 2.75* 2.34* 2.04*  CALCIUM  8.3* 8.4* 7.8* 7.7* 8.8*  MG  --   --  2.1  --   --    Liver Function Tests: Recent Labs  Lab 07/17/20 0455 07/18/20 0432 07/19/20 0423 07/20/20 0811 07/21/20 1033  AST 31 29 29 29  38  ALT  19 20 19 19 21   ALKPHOS 67 65 65 62 62  BILITOT 0.7 0.7 0.6 1.2 1.2  PROT 6.6 6.7 6.3* 6.2* 6.8  ALBUMIN 3.3* 3.2* 3.0* 3.2* 3.4*   Recent Labs  Lab 07/15/20 2036  LIPASE 38   No results for input(s): AMMONIA in the last 168 hours. CBC: Recent Labs  Lab 07/17/20 0455 07/18/20 0432 07/19/20 0423 07/20/20 0811 07/21/20 1033  WBC 4.0 8.7 7.8 6.2 6.8  NEUTROABS 2.9 6.8 6.3 4.5 4.8  HGB 10.9* 11.4* 11.0* 11.1* 11.4*  HCT 33.7* 33.7* 32.9* 33.1* 34.5*  MCV 96.0 91.3 90.9 91.9 93.5  PLT 184 212 209 217 258   Cardiac Enzymes: Recent Labs  Lab 07/16/20 0545  CKTOTAL 845*   BNP: BNP (last 3 results) No results for input(s): BNP in the last 8760 hours.  ProBNP (last 3 results) No results for input(s): PROBNP in the last 8760 hours.  CBG: No results for input(s): GLUCAP in the last 168 hours.     Signed:  Ramiro Harvest MD.  [facility] 07/21/2020, 2:10 PM

## 2020-08-01 ENCOUNTER — Ambulatory Visit: Payer: Medicaid Other | Admitting: Internal Medicine

## 2020-08-06 ENCOUNTER — Ambulatory Visit: Payer: Medicaid Other | Admitting: Internal Medicine

## 2020-08-13 ENCOUNTER — Other Ambulatory Visit: Payer: Self-pay

## 2020-08-14 ENCOUNTER — Encounter: Payer: Self-pay | Admitting: Internal Medicine

## 2020-08-14 ENCOUNTER — Ambulatory Visit (INDEPENDENT_AMBULATORY_CARE_PROVIDER_SITE_OTHER): Payer: Medicaid Other | Admitting: Internal Medicine

## 2020-08-14 VITALS — BP 123/87 | HR 95 | Temp 97.2°F | Resp 18 | Ht 67.0 in | Wt 214.4 lb

## 2020-08-14 DIAGNOSIS — Z13228 Encounter for screening for other metabolic disorders: Secondary | ICD-10-CM

## 2020-08-14 DIAGNOSIS — Z0000 Encounter for general adult medical examination without abnormal findings: Secondary | ICD-10-CM

## 2020-08-14 DIAGNOSIS — Z1159 Encounter for screening for other viral diseases: Secondary | ICD-10-CM

## 2020-08-14 DIAGNOSIS — E782 Mixed hyperlipidemia: Secondary | ICD-10-CM

## 2020-08-14 NOTE — Progress Notes (Signed)
Subjective:    Alan Owens - 40 y.o. male MRN [id number]  Date of birth: [birth year]  HPI  Alan Owens is here for annual exam. Presents with his mother due to cognitive impairment. She has no concerns.    Depression screen [facility] 2/9 08/14/2020 08/13/2019 06/16/2018  Decreased Interest 0 0 0  Down, Depressed, Hopeless 0 0 0  PHQ - 2 Score 0 0 0  Altered sleeping 0 - 0  Tired, decreased energy 0 - 0  Change in appetite 0 - 0  Feeling bad or failure about yourself  0 - 0  Trouble concentrating 0 - 0  Moving slowly or fidgety/restless 0 - 0  Suicidal thoughts 0 - 0  PHQ-9 Score 0 - 0  Difficult doing work/chores Not difficult at all - -   GAD 7 : Generalized Anxiety Score 08/14/2020 06/16/2018  Nervous, Anxious, on Edge 0 0  Control/stop worrying 0 0  Worry too much - different things 0 0  Trouble relaxing 0 0  Restless 0 0  Easily annoyed or irritable 0 0  Afraid - awful might happen 0 0  Total GAD 7 Score 0 0  Anxiety Difficulty Not difficult at all -      Health Maintenance:  Health Maintenance Due  Topic Date Due  . Hepatitis C Screening  Never done    -  reports that he has never smoked. He has never used smokeless tobacco. - Review of Systems: Per HPI. - Past Medical History: Patient Active Problem List   Diagnosis Date Noted  . Hypertension   . Pneumonia due to COVID-19 virus 07/16/2020  . ARF (acute renal failure) (HCC) 07/15/2020  . Glaucoma 09/10/2018  . Congestive heart failure (HCC) 09/10/2018  . Dysphagia 09/10/2018  . Mixed hyperlipidemia 09/10/2018  . Cognitive impairment 09/10/2018  . Skin picking habit 09/10/2018   - Medications: reviewed and updated   Objective:   Physical Exam BP 123/87 (BP Location: Right Arm, Patient Position: Sitting, Cuff Size: Large)   Pulse 95   Temp (!) 97.2 F (36.2 C) (Temporal)   Resp 18   Ht 5\' 7"  (1.702 m)   Wt 214 lb 6.4 oz (97.3 kg)   SpO2 95%   BMI 33.58 kg/m  Physical Exam Constitutional:       Appearance: He is not diaphoretic.  HENT:     Head: Normocephalic and atraumatic.     Mouth/Throat:     Mouth: Oropharynx is clear and moist. Mucous membranes are moist.     Pharynx: No posterior oropharyngeal erythema.     Comments: No teeth present      Comments: TMs normal bilaterally Eyes:     Extraocular Movements: EOM normal.     Conjunctiva/sclera: Conjunctivae normal.     Pupils: Pupils are equal, round, and reactive to light.  Neck:     Thyroid: No thyromegaly.  Cardiovascular:     Rate and Rhythm: Normal rate and regular rhythm.     Pulses: Intact distal pulses.     Heart sounds: Normal heart sounds. No murmur heard.   Pulmonary:     Effort: Pulmonary effort is normal. No respiratory distress.     Breath sounds: Normal breath sounds. No wheezing.  Abdominal:     General: Bowel sounds are normal. There is no distension.     Palpations: Abdomen is soft.     Tenderness: There is no abdominal tenderness. There is no guarding or rebound.  Musculoskeletal:  General: No deformity or edema. Normal range of motion.     Cervical back: Normal range of motion and neck supple.  Lymphadenopathy:     Cervical: No cervical adenopathy.  Skin:    General: Skin is warm and dry.     Findings: No rash.  Neurological:     Mental Status: He is alert. Mental status is at baseline.     Gait: Gait is intact.  Psychiatric:        Mood and Affect: Affect normal.     Comments: Non-verbal            Assessment & Plan:   1. Encounter for annual physical exam  2. Screening for metabolic disorder - Comprehensive metabolic panel - Lipid panel  3. Mixed hyperlipidemia - Comprehensive metabolic panel - Lipid panel  4. Need for hepatitis C screening test - Hepatitis C antibody    Marcy Siren, D.O. 08/14/2020, 8:59 AM Primary Care at [facility]

## 2020-08-15 ENCOUNTER — Telehealth: Payer: Self-pay

## 2020-08-15 LAB — COMPREHENSIVE METABOLIC PANEL
ALT: 11 IU/L (ref 0–44)
AST: 9 IU/L (ref 0–40)
Albumin/Globulin Ratio: 1.5 (ref 1.2–2.2)
Albumin: 4.3 g/dL (ref 4.0–5.0)
Alkaline Phosphatase: 131 IU/L — ABNORMAL HIGH (ref 44–121)
BUN/Creatinine Ratio: 8 — ABNORMAL LOW (ref 9–20)
BUN: 12 mg/dL (ref 6–24)
Bilirubin Total: 0.7 mg/dL (ref 0.0–1.2)
CO2: 21 mmol/L (ref 20–29)
Calcium: 9.9 mg/dL (ref 8.7–10.2)
Chloride: 103 mmol/L (ref 96–106)
Creatinine, Ser: 1.55 mg/dL — ABNORMAL HIGH (ref 0.76–1.27)
GFR calc Af Amer: 63 mL/min/{1.73_m2} (ref >59)
GFR calc non Af Amer: 55 mL/min/{1.73_m2} — ABNORMAL LOW (ref >59)
Globulin, Total: 2.8 g/dL (ref 1.5–4.5)
Glucose: 105 mg/dL — ABNORMAL HIGH (ref 65–99)
Potassium: 4 mmol/L (ref 3.5–5.2)
Sodium: 142 mmol/L (ref 134–144)
Total Protein: 7.1 g/dL (ref 6.0–8.5)

## 2020-08-15 LAB — LIPID PANEL
Chol/HDL Ratio: 5.9 ratio — ABNORMAL HIGH (ref 0.0–5.0)
Cholesterol, Total: 223 mg/dL — ABNORMAL HIGH (ref 100–199)
HDL: 38 mg/dL — ABNORMAL LOW (ref >39)
LDL Chol Calc (NIH): 144 mg/dL — ABNORMAL HIGH (ref 0–99)
Triglycerides: 228 mg/dL — ABNORMAL HIGH (ref 0–149)
VLDL Cholesterol Cal: 41 mg/dL — ABNORMAL HIGH (ref 5–40)

## 2020-08-15 LAB — HEPATITIS C ANTIBODY: Hep C Virus Ab: <0.1 s/co ratio (ref 0.0–0.9)

## 2020-08-15 NOTE — Telephone Encounter (Signed)
Pt's mother saw his results via MyChart and requested a review. Discussed lipid results and diet recommendations, she wants to know if this means pt's atorvastatin dose should be increased. Also, wants further explaination re: kidney function tests, reports pt has been c/o hurting "down there" and that she is concerned of infection because pt has stool in briefs that gets on/in penis at times, she forgot to mention this at OV yesterday, pls advise.

## 2020-08-16 ENCOUNTER — Other Ambulatory Visit: Payer: Self-pay | Admitting: Nurse Practitioner

## 2020-08-16 ENCOUNTER — Other Ambulatory Visit: Payer: Self-pay | Admitting: Cardiovascular Disease

## 2020-08-19 NOTE — Telephone Encounter (Signed)
Sent you lab result message back to address these concerns/follow up.   Marcy Siren, D.O. Primary Care at [facility]  08/19/2020, 2:04 PM

## 2020-08-25 ENCOUNTER — Other Ambulatory Visit: Payer: Self-pay | Admitting: Family Medicine

## 2020-09-04 ENCOUNTER — Ambulatory Visit (INDEPENDENT_AMBULATORY_CARE_PROVIDER_SITE_OTHER): Payer: Medicaid Other | Admitting: Gastroenterology

## 2020-09-04 ENCOUNTER — Encounter: Payer: Self-pay | Admitting: Gastroenterology

## 2020-09-04 ENCOUNTER — Other Ambulatory Visit: Payer: Self-pay

## 2020-09-04 VITALS — BP 140/94 | HR 110 | Ht 67.0 in | Wt 221.0 lb

## 2020-09-04 DIAGNOSIS — R053 Chronic cough: Secondary | ICD-10-CM

## 2020-09-04 NOTE — Progress Notes (Signed)
[facility] Consult Note:  History: Alan Owens 09/04/2020  Referring provider: Arvilla Market, DO  Reason for consult/chief complaint: Cough (Usually at night.  Patient will cough and then vomit at times.  Patient's mother has tried to prop patient up with pillows, but he can not stay up. 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  He could be having aspiration, but his mother says he is on a pured diet.  She is uncertain the extent of the problem and what dietary or swallowing maneuvers might be helpful to decrease his risk of aspiration.  Modified barium swallow ordered.   Abdominal pain cannot be characterized any further due to limits communicating with this patient.  Consider H. pylori gastritis, peptic ulcer, less likely malignancy given its duration.  Plan:   In addition to MBS, upper endoscopy scheduled."  EGD 09/11/2018 was a normal study _______________________  Alan Owens's mother and sister brought him in today with concerns of a chronic cough that occurs at night and thought this might be reflux in nature. His mother had told Dr. Earlene Owens about it several months back, it seems that maybe when he was started back on omeprazole 40 mg at bedtime. This does not seem to have improved his symptoms. His mother notes that he will get episodes of coughing at night to the point that he then might bring up some material that looks like undigested food. He usually has his last meal at least several hours before bed, though sometimes she has difficulty keeping him from snacking in the evening. She has tried elevating his head of bed but he will not remain that way overnight. As such, she is wondering if a elevated hospital bed might be helpful. He is on some kind of nasal spray, and appears to been prescribed Atarax for bedtime use. No cough  suppression prescribed so far.  Alan Owens is unable to give a history or review of systems since he is mostly nonverbal   Of note, his mother and sisters say that since the hospitalization for Covid he has "changed" where he is anxious to be away from them or come to the doctor's office. He seems to been somewhat traumatized by being away from his mother in the hospital for days.  Past Medical History: Past Medical History:  Diagnosis Date  . Acid reflux   . Blind left eye   . Congestive heart failure (HCC)   . Hypercholesterolemia   . Hypertension   . Juvenile glaucoma   . Mental developmental delay   . Renal failure   . Vitamin D deficiency    Alan Owens was hospitalized for Covid related pneumonia without hypoxia but with acute renal failure in early January.  ACE inhibitor was discontinued, diuretics resumed several days after discharge.  ( discharge summary reviewed)  Past Surgical History: Past Surgical History:  Procedure Laterality Date  . MULTIPLE TOOTH EXTRACTIONS Bilateral 2009   all teeth extracted per patient's mother     Family History: Family History  Problem Relation Age of Onset  . Diabetes Sibling   . Hypertension Sibling   . Colon cancer Neg Hx   . Esophageal cancer Neg Hx   . Stomach cancer Neg Hx   . Rectal cancer Neg Hx     Social History: Social History   Socioeconomic History  . Marital status: Single    Spouse name:  Not on file  . Number of children: Not on file  . Years of education: Not on file  . Highest education level: Not on file  Occupational History  . Not on file  Tobacco Use  . Smoking status: Never Smoker  . Smokeless tobacco: Never Used  Vaping Use  . Vaping Use: Never used  Substance and Sexual Activity  . Alcohol use: Never  . Drug use: Never  . Sexual activity: Never  Other Topics Concern  . Not on file  Social History Narrative  . Not on file   Social Determinants of Health   Financial Resource Strain: Not on file   Food Insecurity: Not on file  Transportation Needs: Not on file  Physical Activity: Not on file  Stress: Not on file  Social Connections: Not on file    Allergies: No Known Allergies  Outpatient Meds: Current Outpatient Medications  Medication Sig Dispense Refill  . ALPHAGAN P 0.1 % SOLN Apply 1 drop to eye in the morning and at bedtime.    Marland Kitchen amLODipine (NORVASC) 2.5 MG tablet TAKE 1 TABLET BY MOUTH EVERY DAY 90 tablet 1  . ascorbic acid (VITAMIN C) 500 MG tablet Take 1 tablet (500 mg total) by mouth daily.    . ASPIRIN LOW DOSE 81 MG EC tablet TAKE 1 TABLET BY MOUTH EVERY DAY 30 tablet 6  . atorvastatin (LIPITOR) 20 MG tablet Take 1 tablet (20 mg total) by mouth daily at 6 PM. 90 tablet 3  . carvedilol (COREG) 6.25 MG tablet TAKE 1 TABLET (6.25 MG TOTAL) BY MOUTH 2 (TWO) TIMES DAILY WITH A MEAL. 180 tablet 1  . furosemide (LASIX) 20 MG tablet Take 1 tablet (20 mg total) by mouth daily. 90 tablet 3  . hydrOXYzine (ATARAX/VISTARIL) 50 MG tablet TAKE 1 TABLET BY MOUTH AT NIGHT 30 tablet 2  . Ipratropium-Albuterol (COMBIVENT) 20-100 MCG/ACT AERS respimat Inhale 2 puffs into the lungs 2 (two) times daily. Use 2 times daily x 1 week, then as needed. 4 g 1  . latanoprost (XALATAN) 0.005 % ophthalmic solution Place 1 drop into both eyes at bedtime. 2.5 mL 1  . omeprazole (PRILOSEC) 40 MG capsule Take 1 capsule (40 mg total) by mouth daily. 30 capsule 3  . Potassium Chloride ER 20 MEQ TBCR Take 20 mEq by mouth daily. 90 tablet 1  . zinc sulfate 220 (50 Zn) MG capsule Take 1 capsule (220 mg total) by mouth daily.     No current facility-administered medications for this visit.      ___________________________________________________________________ Objective   Exam:  BP (!) 140/94 (BP Location: Right Arm, Patient Position: Sitting, Cuff Size: Large)   Pulse (!) 110   Ht 5\' 7"  (1.702 m)   Wt 221 lb (100.2 kg)   BMI 34.61 kg/m  Wt Readings from Last 3 Encounters:  09/04/20 221 lb  (100.2 kg)  08/14/20 214 lb 6.4 oz (97.3 kg)  07/20/20 220 lb 7.4 oz (100 kg)     General: Cranial deformity, poor eye contact, nonverbal. He does follow basic commands well, gets on exam table and can lay back when asked. I was unable to get him to take some deep breaths.  Eyes: sclera anicteric, no redness  ENT: oral mucosa moist without lesions, no cervical or supraclavicular lymphadenopathy  CV: RRR without murmur, S1/S2, no JVD, no peripheral edema  Resp: clear to auscultation bilaterally, normal RR and effort noted  GI: soft, no apparent tenderness, with active bowel sounds. No  guarding or palpable organomegaly noted.  Skin; warm and dry, no rash or jaundice noted  Neuro: awake, alert and oriented x 3. Normal gross motor function and fluent speech   Radiologic Studies:  Modified barium study 09/06/2018:  Pt presents with functional oral and pharyngeal swallow despite oral anatomical differences. At rest, pt's larger than normal tongue protrudes from mouth. During PO intake of solid and liquids, tip of tongue observed to bend back as if folding in half during bolus manipulation. No oral weakness, discoordination or pharyngeal implications noted with this activity. Due to edentulous status pt used tongue to mush up soft and mixed consistency solids on roof of mouth in place of chewing. No aspiration observed with thin liquid or solid PO trials. Pill with thin swallowed after a few faulty attempts and passed through cervical esophagus successfully. Recommend puree and soft solid diet with thin liquids. Educated pt's mother regarding results, recommendations and reflux precautions. No further f/u from SLP warranted at this time. SLP Visit Diagnosis -- Attention and concentration deficit following -- Frontal lobe and executive function deficit following -- Impact on safety and function Mild aspiration risk   Assessment: Encounter Diagnosis  Name Primary?  . Chronic cough Yes    It is very difficult to know if this cough is reflux related. He is apparently not improved on a trial of PPI, which suggested might not be reflux related. Other causes of cough such as reactive airway disease, pharyngeal dysfunction and upper airway cough syndrome, postnasal drip from allergies and sinus difficulty should also be considered. I agree it would be equally difficult to rule out those various diagnoses, and it seems like primary care has done due diligence trying empiric therapy for such things. I did recommend reducing the PPI to before his evening meal rather than bedtime for more acid suppression.  An upper endoscopy with placement of a wireless pH device while off PPI therapy should be able to detect if [facility] is having significant reflux, including nocturnal episodes. However, even with that data we may not be able to get good symptom correlation on monitoring. He is not able to verbalize his symptoms. On a logistical note, patient must also keep the wireless data recorder box within 3 feet of them at all times for the 48-hour study. He and his mother seem to doubt this would be feasible since he does not always follow directions from them and he would require constant attention during that time to ensure data capture. Added to that, it sounds like he has had significant healthcare related anxiety in the wake of his recent COVID hospitalization.  I will communicate with Dr. Earlene Owens to see what other avenues she has or will explore regarding work-up and treatment for the chronic cough. Perhaps a codeine-containing cough medicine at night might help relieve his symptoms and thus avoid the need for any further invasive testing. I agree that chronic cough at night of almost any cause might improve with elevating head of bed. His mother inquired about getting a hospital bed, and I will pose this question to Dr. Earlene Owens to see if she might have some way to arrange that for him, even if just on  a trial basis.   Total time 33 minutes, over half spent in discussion with his mother and sister regarding work-up and treatment of chronic cough as well as the challenges of endoscopic testing as noted above.  Charlie Pitter III  CC: Referring provider noted above

## 2020-09-04 NOTE — Patient Instructions (Signed)
If you are age 42 or older, your body mass index should be between 23-30. Your Body mass index is 34.61 kg/m. If this is out of the aforementioned range listed, please consider follow up with your Primary Care Provider.  If you are age 63 or younger, your body mass index should be between 19-25. Your Body mass index is 34.61 kg/m. If this is out of the aformentioned range listed, please consider follow up with your Primary Care Provider.   It was a pleasure to see you today!  Dr. Myrtie Neither

## 2020-09-08 ENCOUNTER — Other Ambulatory Visit: Payer: Self-pay | Admitting: Nurse Practitioner

## 2020-10-04 ENCOUNTER — Other Ambulatory Visit: Payer: Self-pay | Admitting: Family Medicine

## 2020-10-04 NOTE — Telephone Encounter (Signed)
Refusing request-prescriber not at this practice.

## 2020-10-19 ENCOUNTER — Other Ambulatory Visit: Payer: Self-pay | Admitting: Internal Medicine

## 2020-10-22 ENCOUNTER — Other Ambulatory Visit: Payer: Self-pay | Admitting: Family Medicine

## 2020-10-26 ENCOUNTER — Other Ambulatory Visit: Payer: Self-pay | Admitting: Internal Medicine

## 2020-10-26 DIAGNOSIS — K219 Gastro-esophageal reflux disease without esophagitis: Secondary | ICD-10-CM

## 2020-10-29 ENCOUNTER — Other Ambulatory Visit: Payer: Self-pay

## 2020-10-29 MED ORDER — POTASSIUM CHLORIDE ER 20 MEQ PO TBCR: 20.0000 meq | EXTENDED_RELEASE_TABLET | Freq: Every day | ORAL | 0 refills | Status: DC

## 2020-11-18 NOTE — Progress Notes (Signed)
Patient ID: Alan Owens, male    DOB: October 11, 1980  MRN: [id number]  CC: Left Foot Pain  Subjective: [name] is a 40 y.o. male who presents for left foot pain. He is accompanied by his Mother.  His concerns today include:   1. FOOT PAIN:  Reports she initially noticed the patient had left foot pain to touch with swelling 2 days ago. He was limping and and would not put pressure on the foot. Has been unable to wear a shoe since then, wearing sandal slides instead.  Mother reports the patient does not talk, may say a few words here and there. 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 Reports he is also incontinent, kidney problems, heart problems, and hypertension. She is unsure if there was trauma to the foot.  Reports family history of diabetes and would like to have patient screened.   Patient Active Problem List   Diagnosis Date Noted  . Hypertension   . Pneumonia due to COVID-19 virus 07/16/2020  . ARF (acute renal failure) (HCC) 07/15/2020  . Glaucoma 09/10/2018  . Congestive heart failure (HCC) 09/10/2018  . Dysphagia 09/10/2018  . Mixed hyperlipidemia 09/10/2018  . Cognitive impairment 09/10/2018  . Skin picking habit 09/10/2018     Current Outpatient Medications on File Prior to Visit  Medication Sig Dispense Refill  . ALPHAGAN P 0.1 % SOLN Apply 1 drop to eye in the morning and at bedtime.    Marland Kitchen amLODipine (NORVASC) 2.5 MG tablet TAKE 1 TABLET BY MOUTH EVERY DAY 90 tablet 1  . ascorbic acid (VITAMIN C) 500 MG tablet Take 1 tablet (500 mg total) by mouth daily.    . ASPIRIN LOW DOSE 81 MG EC tablet TAKE 1 TABLET BY MOUTH EVERY DAY 30 tablet 6  . atorvastatin (LIPITOR) 20 MG tablet Take 1 tablet (20 mg total) by mouth daily at 6 PM. 90 tablet 3  . carvedilol (COREG) 6.25 MG tablet TAKE 1 TABLET (6.25 MG TOTAL) BY MOUTH 2 (TWO) TIMES DAILY WITH A MEAL. 180 tablet 1  . furosemide (LASIX) 20 MG tablet Take 1 tablet  (20 mg total) by mouth daily. 90 tablet 3  . hydrOXYzine (ATARAX/VISTARIL) 50 MG tablet TAKE 1 TABLET BY MOUTH EVERY DAY AT NIGHT 30 tablet 2  . Ipratropium-Albuterol (COMBIVENT) 20-100 MCG/ACT AERS respimat Inhale 2 puffs into the lungs 2 (two) times daily. Use 2 times daily x 1 week, then as needed. 4 g 1  . latanoprost (XALATAN) 0.005 % ophthalmic solution Place 1 drop into both eyes at bedtime. 2.5 mL 1  . omeprazole (PRILOSEC) 40 MG capsule TAKE 1 CAPSULE (40 MG TOTAL) BY MOUTH DAILY. 30 capsule 3  . Potassium Chloride ER 20 MEQ TBCR Take 20 mEq by mouth daily. 90 tablet 0  . zinc sulfate 220 (50 Zn) MG capsule Take 1 capsule (220 mg total) by mouth daily.     No current facility-administered medications on file prior to visit.    No Known Allergies  Social History   Socioeconomic History  . Marital status: Single    Spouse name: Not on file  . Number of children: Not on file  . Years of education: Not on file  . Highest education level: Not on file  Occupational History  . Not on file  Tobacco Use  . Smoking status: Never Smoker  . Smokeless tobacco: Never Used  Vaping Use  . Vaping Use: Never used  Substance and Sexual Activity  . Alcohol use: Never  . Drug use: Never  . Sexual activity: Never  Other Topics Concern  . Not on file  Social History Narrative  . Not on file   Social Determinants of Health   Financial Resource Strain: Not on file  Food Insecurity: Not on file  Transportation Needs: Not on file  Physical Activity: Not on file  Stress: Not on file  Social Connections: Not on file  Intimate Partner Violence: Not on file    Family History  Problem Relation Age of Onset  . Diabetes Sibling   . Hypertension Sibling   . Colon cancer Neg Hx   . Esophageal cancer Neg Hx   . Stomach cancer Neg Hx   . Rectal cancer Neg Hx     Past Surgical History:  Procedure Laterality Date  . MULTIPLE TOOTH EXTRACTIONS Bilateral 2009   all teeth extracted per  patient's mother    ROS: Review of Systems Negative except as stated above  Physical Exam HENT:     Head: Normocephalic and atraumatic.  Eyes:     Extraocular Movements: Extraocular movements intact.     Conjunctiva/sclera: Conjunctivae normal.     Pupils: Pupils are equal, round, and reactive to light.  Cardiovascular:     Pulses: Normal pulses.     Heart sounds: Normal heart sounds.  Pulmonary:     Effort: Pulmonary effort is normal.     Breath sounds: Normal breath sounds.  Musculoskeletal:     Cervical back: Normal range of motion and neck supple.     Left foot: Swelling present.     Comments: Left great toe with edema and warmth. No breaks in skin.  Neurological:     Mental Status: He is alert. Mental status is at baseline.  Psychiatric:        Behavior: Behavior normal.     ASSESSMENT AND PLAN: 1. Left foot pain: - Diagnostic x-ray left foot for further evaluation.  - Uric acid to screen for possible gout.  - Follow-up with primary provider as scheduled. - DG Foot Complete Left; Future - Uric Acid  2. Diabetes mellitus screening: 3. Prediabetes: - Hemoglobin A1c today 5.7% and consistent with prediabetes.  - Discussed the importance of healthy eating habits, low-carbohydrate diet, low-sugar diet, and regular aerobic exercise (at least 150 minutes a week as tolerated).  - Follow-up with primary provider in 6 months or sooner if needed.  - POCT glycosylated hemoglobin (Hb A1C)   Patient was given the opportunity to ask questions.  Patient verbalized understanding of the plan and was able to repeat key elements of the plan. Patient was given clear instructions to go to Emergency Department or return to medical center if symptoms don't improve, worsen, or new problems develop.The patient verbalized understanding.   Orders Placed This Encounter  Procedures  . DG Foot Complete Left  . Uric Acid  . POCT glycosylated hemoglobin (Hb A1C)    Requested  Prescriptions   Signed Prescriptions Disp Refills  . predniSONE (DELTASONE) 10 MG tablet 20 tablet 0    Sig: Take 4 tablets (40 mg total) by mouth daily for 5 days.   Follow-up with primary provider in 6 months or sooner if needed for prediabetes.    Rema Fendt, NP

## 2020-11-19 ENCOUNTER — Other Ambulatory Visit: Payer: Self-pay

## 2020-11-19 ENCOUNTER — Ambulatory Visit (INDEPENDENT_AMBULATORY_CARE_PROVIDER_SITE_OTHER): Payer: Medicaid Other

## 2020-11-19 ENCOUNTER — Ambulatory Visit (INDEPENDENT_AMBULATORY_CARE_PROVIDER_SITE_OTHER): Payer: Medicaid Other | Admitting: Family

## 2020-11-19 ENCOUNTER — Encounter: Payer: Self-pay | Admitting: Family

## 2020-11-19 DIAGNOSIS — M79672 Pain in left foot: Secondary | ICD-10-CM

## 2020-11-19 DIAGNOSIS — M109 Gout, unspecified: Secondary | ICD-10-CM

## 2020-11-19 DIAGNOSIS — R7303 Prediabetes: Secondary | ICD-10-CM | POA: Diagnosis not present

## 2020-11-19 DIAGNOSIS — Z131 Encounter for screening for diabetes mellitus: Secondary | ICD-10-CM | POA: Diagnosis not present

## 2020-11-19 LAB — POCT GLYCOSYLATED HEMOGLOBIN (HGB A1C): Hemoglobin A1C: 5.7 % — AB (ref 4.0–5.6)

## 2020-11-19 NOTE — Progress Notes (Signed)
Left foot swollen for 2 days Tender and painful to touch Pt accompanied by mother Alan Owens which states she would like him checked for diabetes due to family history

## 2020-11-20 ENCOUNTER — Telehealth: Payer: Self-pay | Admitting: Internal Medicine

## 2020-11-20 LAB — URIC ACID: Uric Acid: 9.8 mg/dL — ABNORMAL HIGH (ref 3.8–8.4)

## 2020-11-20 MED ORDER — PREDNISONE 10 MG PO TABS: ORAL_TABLET | ORAL | 0 refills | Status: DC

## 2020-11-20 NOTE — Telephone Encounter (Signed)
Pt's mother called stating she had given some papers to Amy to be filled out on her son's 11/19/20 appointment.  Papers need to be  faxed to [phone].

## 2020-11-20 NOTE — Progress Notes (Signed)
Prednisone prescribed for gout.

## 2020-11-21 ENCOUNTER — Telehealth: Payer: Self-pay | Admitting: Internal Medicine

## 2020-11-21 DIAGNOSIS — R7303 Prediabetes: Secondary | ICD-10-CM | POA: Insufficient documentation

## 2020-11-21 NOTE — Telephone Encounter (Signed)
Spoke to pt mother aware Bilbo uric acid level was high which indicated gout

## 2020-11-21 NOTE — Telephone Encounter (Signed)
Pt's mother called asking if she can get a call back about pt's lab results. She stated she did not understand them.

## 2020-11-21 NOTE — Progress Notes (Signed)
No fracture

## 2020-12-01 ENCOUNTER — Other Ambulatory Visit: Payer: Self-pay | Admitting: Family

## 2020-12-01 DIAGNOSIS — M109 Gout, unspecified: Secondary | ICD-10-CM

## 2020-12-01 MED ORDER — PREDNISONE 20 MG PO TABS: 20.0000 mg | ORAL_TABLET | Freq: Two times a day (BID) | ORAL | 0 refills | Status: AC

## 2020-12-01 NOTE — Telephone Encounter (Signed)
Pt's mother called stating the gout has flared back up and she doesn't know what to do. Would like a call back to talk about what she could do.

## 2020-12-01 NOTE — Telephone Encounter (Signed)
Will repeat course of Prednisone for additional 10 days. Referral to Rheumatology for further evaluation and management. Their office should call patient within 2 weeks with appointment details.

## 2020-12-10 ENCOUNTER — Other Ambulatory Visit: Payer: Self-pay

## 2020-12-10 MED ORDER — ATORVASTATIN CALCIUM 20 MG PO TABS: 20.0000 mg | ORAL_TABLET | Freq: Every day | ORAL | 0 refills | Status: DC

## 2021-01-13 ENCOUNTER — Other Ambulatory Visit: Payer: Self-pay

## 2021-01-13 ENCOUNTER — Ambulatory Visit: Payer: Medicaid Other | Admitting: Internal Medicine

## 2021-01-13 ENCOUNTER — Encounter: Payer: Self-pay | Admitting: Internal Medicine

## 2021-01-13 VITALS — BP 128/88 | HR 92 | Ht 68.0 in | Wt 223.0 lb

## 2021-01-13 DIAGNOSIS — I509 Heart failure, unspecified: Secondary | ICD-10-CM | POA: Diagnosis not present

## 2021-01-13 DIAGNOSIS — M1A9XX0 Chronic gout, unspecified, without tophus (tophi): Secondary | ICD-10-CM

## 2021-01-13 DIAGNOSIS — M109 Gout, unspecified: Secondary | ICD-10-CM | POA: Insufficient documentation

## 2021-01-13 MED ORDER — COLCHICINE 0.6 MG PO TABS
0.3000 mg | ORAL_TABLET | Freq: Every day | ORAL | 0 refills | Status: DC
Start: 2021-01-13 — End: 2021-01-29

## 2021-01-13 MED ORDER — ALLOPURINOL 100 MG PO TABS: 100.0000 mg | ORAL_TABLET | Freq: Every day | ORAL | 0 refills | Status: DC

## 2021-01-13 NOTE — Patient Instructions (Signed)
Gout  Gout is a condition that causes painful swelling of the joints. Gout is a type of inflammation of the joints (arthritis). This condition is caused by having too much uric acid in the body. Uric acid is a chemical that forms when the body breaks down substances called purines.Purines are important for building body proteins. When the body has too much uric acid, sharp crystals can form and build up inside the joints. This causes pain and swelling. Gout attacks can happen quickly and may be very painful (acute gout). Over time, the attacks can affect more joints and become more frequent (chronic gout). Gout can also cause uric acid to build up under the skin and inside thekidneys. What are the causes? This condition is caused by too much uric acid in your blood. This can happen because: Your kidneys do not remove enough uric acid from your blood. This is the most common cause. Your body makes too much uric acid. This can happen with some cancers and cancer treatments. It can also occur if your body is breaking down too many red blood cells (hemolytic anemia). You eat too many foods that are high in purines. These foods include organ meats and some seafood. Alcohol, especially beer, is also high in purines. A gout attack may be triggered by trauma or stress. What increases the risk? You are more likely to develop this condition if you: Have a family history of gout. Are male and middle-aged. Are male and have gone through menopause. Are obese. Frequently drink alcohol, especially beer. Are dehydrated. Lose weight too quickly. Have an organ transplant. Have lead poisoning. Take certain medicines, including aspirin, cyclosporine, diuretics, levodopa, and niacin. Have kidney disease. Have a skin condition called psoriasis. What are the signs or symptoms? An attack of acute gout happens quickly. It usually occurs in just one joint. The most common place is the big toe. Attacks often start  at night. Other joints that may be affected include joints of the feet, ankle, knee, fingers, wrist, or elbow. Symptoms of this condition may include: Severe pain. Warmth. Swelling. Stiffness. Tenderness. The affected joint may be very painful to touch. Shiny, red, or purple skin. Chills and fever. Chronic gout may cause symptoms more frequently. More joints may be involved. You may also have white or yellow lumps (tophi) on your hands or feet or in other areas near your joints. How is this diagnosed? This condition is diagnosed based on your symptoms, medical history, and physical exam. You may have tests, such as: Blood tests to measure uric acid levels. Removal of joint fluid with a thin needle (aspiration) to look for uric acid crystals. X-rays to look for joint damage. How is this treated? Treatment for this condition has two phases: treating an acute attack and preventing future attacks. Acute gout treatment may include medicines to reduce pain and swelling, including: NSAIDs. Steroids. These are strong anti-inflammatory medicines that can be taken by mouth (orally) or injected into a joint. Colchicine. This medicine relieves pain and swelling when it is taken soon after an attack. It can be given by mouth or through an IV. Preventive treatment may include: Daily use of smaller doses of NSAIDs or colchicine. Use of a medicine that reduces uric acid levels in your blood. Changes to your diet. You may need to see a dietitian about what to eat and drink to prevent gout. Follow these instructions at home: During a gout attack  If directed, put ice on the affected area: Put  ice in a plastic bag. Place a towel between your skin and the bag. Leave the ice on for 20 minutes, 2-3 times a day. Raise (elevate) the affected joint above the level of your heart as often as possible. Rest the joint as much as possible. If the affected joint is in your leg, you may be given crutches to  use. Follow instructions from your health care provider about eating or drinking restrictions.  Avoiding future gout attacks Follow a low-purine diet as told by your dietitian or health care provider. Avoid foods and drinks that are high in purines, including liver, kidney, anchovies, asparagus, herring, mushrooms, mussels, and beer. Maintain a healthy weight or lose weight if you are overweight. If you want to lose weight, talk with your health care provider. It is important that you do not lose weight too quickly. Start or maintain an exercise program as told by your health care provider. Eating and drinking Drink enough fluids to keep your urine pale yellow. If you drink alcohol: Limit how much you use to: 0-1 drink a day for women. 0-2 drinks a day for men. Be aware of how much alcohol is in your drink. In the [place] one drink equals one 12 oz bottle of beer (355 mL) one 5 oz glass of wine (148 mL), or one 1 oz glass of hard liquor (44 mL). General instructions Take over-the-counter and prescription medicines only as told by your health care provider. Do not drive or use heavy machinery while taking prescription pain medicine. Return to your normal activities as told by your health care provider. Ask your health care provider what activities are safe for you. Keep all follow-up visits as told by your health care provider. This is important. Contact a health care provider if you have: Another gout attack. Continuing symptoms of a gout attack after 10 days of treatment. Side effects from your medicines. Chills or a fever. Burning pain when you urinate. Pain in your lower back or belly. Get help right away if you: Have severe or uncontrolled pain. Cannot urinate. Summary Gout is painful swelling of the joints caused by inflammation. The most common site of pain is the big toe, but it can affect other joints in the body. Medicines and dietary changes can help to prevent and treat gout  attacks.  Allopurinol Tablets What is this medication? ALLOPURINOL (al oh PURE i nole) treats gout or kidney stones. It may also be used to prevent high uric acid levels after chemotherapy. It works bydecreasing uric acid levels in your body. This medicine may be used for other purposes; ask your health care provider orpharmacist if you have questions. COMMON BRAND NAME(S): Zyloprim What should I tell my care team before I take this medication? They need to know if you have any of these conditions: Kidney disease Liver disease An unusual or allergic reaction to allopurinol, other medications, foods, dyes, or preservatives Pregnant or trying to get pregnant Breast-feeding How should I use this medication? Take this medication by mouth with a glass of water. Follow the directions on the prescription label. If this medication upsets your stomach, take it with food or milk. Take your doses at regular intervals. Do not take your medicationmore often than directed. Talk to your care team regarding the use of this medication in children. Special care may be needed. While this medication may be prescribed forchildren as young as 6 years for selected conditions, precautions do apply. Overdosage: If you think you have taken too  much of this medicine contact apoison control center or emergency room at once. NOTE: This medicine is only for you. Do not share this medicine with others. What if I miss a dose? If you miss a dose, take it as soon as you can. If it is almost time for yournext dose, take only that dose. Do not take double or extra doses. What may interact with this medication? Do not take this medication with the following: Didanosine, ddI This medication may also interact with the following: Certain antibiotics like amoxicillin, ampicillin Certain medications for cancer Certain medications for immunosuppression like azathioprine, cyclosporine,  mercaptopurine Chlorpropamide Probenecid Sulfinpyrazone Thiazide diuretics, like hydrochlorothiazide Warfarin This list may not describe all possible interactions. Give your health care provider a list of all the medicines, herbs, non-prescription drugs, or dietary supplements you use. Also tell them if you smoke, drink alcohol, or use illegaldrugs. Some items may interact with your medicine. What should I watch for while using this medication? Visit your care team for regular checks on your progress. If you are taking this medication to treat gout, you may not have less frequent attacks at first. Keep taking your medication regularly and the attacks should get better within 2 to 6 weeks. Drink plenty of water (10 to 12 full glasses a day) while you are taking this medication. This will help to reduce stomach upset and reduce therisk of getting gout or kidney stones. Call your care team at once if you get a skin rash together with chills, fever, sore throat, or nausea and vomiting, if you have blood in your urine, ordifficulty passing urine. This medication may cause serious skin reactions. They can happen weeks to months after starting the medication. Contact your care team right away if you notice fevers or flu-like symptoms with a rash. The rash may be red or purple and then turn into blisters or peeling of the skin. Or, you might notice a red rash with swelling of the face, lips or lymph nodes in your neck or under yourarms. Do not take vitamin C without asking your care team. Too much vitamin C canincrease the chance of getting kidney stones. You may get drowsy or dizzy. Do not drive, use machinery, or do anything that needs mental alertness until you know how this medication affects you. Do not stand or sit up quickly, especially if you are an older patient. This reduces the risk of dizzy or fainting spells. Alcohol can make you more drowsy and dizzy. Alcohol can also increase the chance of stomach  problems and increasethe amount of uric acid in your blood. Avoid alcoholic drinks. What side effects may I notice from receiving this medication? Side effects that you should report to your care team as soon as possible: Allergic reactions-skin rash, itching, hives, swelling of the face, lips, tongue, or throat Kidney injury-decrease in the amount of urine, swelling of the ankles, hands, or feet Liver injury-right upper belly pain, loss of appetite, nausea, light-colored stool, dark yellow or brown urine, yellowing skin or eyes, unusual weakness or fatigue Rash, fever, and swollen lymph nodes Redness, blistering, peeling, or loosening of the skin, including inside the mouth Side effects that usually do not require medical attention (report to your careteam if they continue or are bothersome): Diarrhea Drowsiness Nausea Vomiting This list may not describe all possible side effects. Call your doctor for medical advice about side effects. You may report side effects to FDA at1-800-FDA-1088. Where should I keep my medication? Keep out of  the reach of children and pets. Store at room temperature between 15 and 25 degrees C (59 and 77 degrees F). Protect from light and moisture. Throw away any unused medication after theexpiration date.  Colchicine Tablets What is this medication? COLCHICINE (KOL chi seen) prevents and treats gout attacks. It may also be used to treat familial Mediterranean fever (FMF). It works by decreasinginflammation and reducing the buildup of uric acid in your joints. This medicine may be used for other purposes; ask your health care provider orpharmacist if you have questions. COMMON BRAND NAME(S): Colcrys What should I tell my care team before I take this medication? They need to know if you have any of these conditions: Kidney disease Liver disease An unusual or allergic reaction to colchicine, other medications, foods, dyes, or preservatives Pregnant or trying to get  pregnant Breast-feeding How should I use this medication? Take this medication by mouth with a full glass of water. Follow the directions on the prescription label. You can take it with or without food. If it upsets your stomach, take it with food. Take your medication at regular intervals. Donot take your medication more often than directed. A special MedGuide will be given to you by the pharmacist with eachprescription and refill. Be sure to read this information carefully each time. Talk to your care team regarding the use of this medication in children. While this medication may be prescribed for children as young as 42 years old forselected conditions, precautions do apply. Patients over 42 years old may have a stronger reaction and need a smaller dose. Overdosage: If you think you have taken too much of this medicine contact apoison control center or emergency room at once. NOTE: This medicine is only for you. Do not share this medicine with others. What if I miss a dose? If you miss a dose, take it as soon as you can. If it is almost time for yournext dose, take only that dose. Do not take double or extra doses. What may interact with this medication? Do not take this medication with any of the following: Certain antivirals for HIV or hepatitis This medication may also interact with the following: Certain antibiotics like erythromycin or clarithromycin Certain medications for blood pressure, heart disease, irregular heart beat Certain medications for cholesterol like atorvastatin, lovastatin, and simvastatin Certain medications for fungal infections like ketoconazole, itraconazole, or posaconazole Cyclosporine Grapefruit or grapefruit juice This list may not describe all possible interactions. Give your health care provider a list of all the medicines, herbs, non-prescription drugs, or dietary supplements you use. Also tell them if you smoke, drink alcohol, or use illegaldrugs. Some items  may interact with your medicine. What should I watch for while using this medication? Visit your care team for regular checks on your progress. Tell your care teamif your symptoms do not start to get better or if they get worse. You should make sure you get enough vitamin B12 while you are taking this medication. Discuss the foods you eat and the vitamins you take with your careteam. This medication may increase your risk to bruise or bleed. Call you care teamif you notice any unusual bleeding. What side effects may I notice from receiving this medication? Side effects that you should report to your care team as soon as possible: Allergic reactions-skin rash, itching, hives, swelling of the face, lips, tongue, or throat Infection-fever, chills, cough, sore throat, wounds that don't heal, pain or trouble when passing urine, general feeling of discomfort or being  unwell Muscle injury-unusual weakness or fatigue, muscle pain, dark yellow or brown urine, decrease in the amount of urine Pain, tingling, or numbness in the hands or feet Unusual bleeding or bruising Side effects that usually do not require medical attention (report these toyour care team if they continue or are bothersome): Diarrhea Nausea Vomiting This list may not describe all possible side effects. Call your doctor for medical advice about side effects. You may report side effects to FDA at1-800-FDA-1088. Where should I keep my medication? Keep out of the reach of children and pets. Store at room temperature between 15 and 30 degrees C (59 and 86 degrees F). Keep container tightly closed. Protect from light. Throw away any unusedmedication after the expiration date. NOTE: This sheet is a summary. It may not cover all possible information. If you have questions about this medicine, talk to your doctor, pharmacist, orhealth care provider.  2022 Elsevier/Gold Standard (2020-07-22 15:50:29)

## 2021-01-13 NOTE — Progress Notes (Signed)
Office Visit Note  Patient: Alan Owens             Date of Birth: [birth year]           MRN: [id number]             PCP: Arvilla Market, MD Referring: Rema Fendt, NP Visit Date: 01/13/2021  Subjective:  New Patient (Initial Visit) (Per patient's mother, patient complains of bilateral hand and foot pain. )   History of Present Illness: Alan Owens is a 40 y.o. male here for bilateral hand and foot pain with hyperurecemia. He is nonverbal chronically so history is obtained from his mother and chart review. He experienced increased hand and feet pain with swelling ongoing since around 6 weeks ago. This was initially noticed affecting the left foot especially around the great toe joint with redness and swelling and pain with walking or even laying a sheet or clothing atop the foot. He experienced a good improvement with oral prednisone but symptoms returned after stopping this. He also has pain in his hands with some amount of intermittent swelling reported. She reports it is difficult to tell joint swelling due to some chronic peripheral edema related to heart failure.   Labs reviewed 11/2020 Uric acid 9.8  08/2020 HCV neg Alk phos 131  Imaging reviewed 11/2020 Xray left foot IMPRESSION: 1. Slight hallux valgus. Minimal degenerative change of the first metatarsophalangeal joint and great toe interphalangeal joint. 2. Small Achilles tendon enthesophyte. 3. Subjective osteopenia.   Activities of Daily Living:  Patient reports morning stiffness for 1-5 minutes.   Patient Reports nocturnal pain.  Difficulty dressing/grooming: Reports Difficulty climbing stairs: Reports Difficulty getting out of chair: Reports Difficulty using hands for taps, buttons, cutlery, and/or writing: Reports  Review of Systems  Constitutional:  Negative for fatigue.  HENT:  Negative for mouth sores, mouth dryness and nose dryness.   Eyes:  Negative for pain, itching and dryness.   Respiratory:  Positive for shortness of breath. Negative for cough, hemoptysis and difficulty breathing.   Cardiovascular:  Positive for swelling in legs/feet. Negative for chest pain and palpitations.  Gastrointestinal:  Negative for abdominal pain, blood in stool, constipation and diarrhea.  Endocrine: Negative for increased urination.  Genitourinary:  Negative for painful urination.  Musculoskeletal:  Positive for joint pain, joint pain, joint swelling, myalgias, muscle weakness, morning stiffness and myalgias. Negative for muscle tenderness.  Skin:  Negative for color change, rash and redness.  Allergic/Immunologic: Negative for susceptible to infections.  Neurological:  Negative for dizziness, numbness, headaches, memory loss and weakness.  Hematological:  Negative for swollen glands.  Psychiatric/Behavioral:  Positive for sleep disturbance. Negative for confusion.    PMFS History:  Patient Active Problem List   Diagnosis Date Noted   Gout 01/13/2021   Prediabetes 11/21/2020   Hypertension    Pneumonia due to COVID-19 virus 07/16/2020   ARF (acute renal failure) (HCC) 07/15/2020   Glaucoma 09/10/2018   Congestive heart failure (HCC) 09/10/2018   Dysphagia 09/10/2018   Mixed hyperlipidemia 09/10/2018   Cognitive impairment 09/10/2018   Skin picking habit 09/10/2018    Past Medical History:  Diagnosis Date   Acid reflux    Blind left eye    Congestive heart failure (HCC)    Hypercholesterolemia    Hypertension    Juvenile glaucoma    Mental developmental delay    Renal failure    Vitamin D deficiency     Family History  Problem Relation  Age of Onset   Emphysema Mother    Anemia Sister    Hypertension Brother    Hypertension Brother    Diabetes Brother    Multiple sclerosis Maternal Grandmother    Hypertension Maternal Grandmother    Emphysema Maternal Grandfather    Lupus Maternal Uncle    Gout Maternal Uncle    Colon cancer Neg Hx    Esophageal cancer Neg Hx     Stomach cancer Neg Hx    Rectal cancer Neg Hx    Past Surgical History:  Procedure Laterality Date   MULTIPLE TOOTH EXTRACTIONS Bilateral 2009   all teeth extracted per patient's mother   Social History   Social History Narrative   Not on file   Immunization History  Administered Date(s) Administered   Tdap 07/13/2015     Objective: Vital Signs: BP 128/88 (BP Location: Right Arm, Patient Position: Sitting, Cuff Size: Large)   Pulse 92   Ht $R'5\' 8"'Vy$  (1.727 m)   Wt 223 lb (101.2 kg)   BMI 33.91 kg/m    Physical Exam   Musculoskeletal Exam:  Shoulders full ROM no tenderness or swelling Elbows full ROM no tenderness or swelling Right hand and wrist appear normal with full range of motion, left hand generalized swelling without focal erythema warmth or synovitis over specific joints range of motion is normal Knees full ROM no tenderness or swelling Right foot appears normal, left foot with swelling over the distal foot most severe around the first MTP joint, ultrasound inspection shows color Doppler enhancement and hyperechoic nodules are deposits in synovial lining, no double contour sign    Investigation: No additional findings.  Imaging: No results found.  Recent Labs: Lab Results  Component Value Date   WBC 6.8 07/21/2020   HGB 11.4 (L) 07/21/2020   PLT 258 07/21/2020   NA 142 08/14/2020   K 4.0 08/14/2020   CL 103 08/14/2020   CO2 21 08/14/2020   GLUCOSE 105 (H) 08/14/2020   BUN 12 08/14/2020   CREATININE 1.55 (H) 08/14/2020   BILITOT 0.7 08/14/2020   ALKPHOS 131 (H) 08/14/2020   AST 9 08/14/2020   ALT 11 08/14/2020   PROT 7.1 08/14/2020   ALBUMIN 4.3 08/14/2020   CALCIUM 9.9 08/14/2020   GFRAA 63 08/14/2020    Speciality Comments: No specialty comments available.  Procedures:  No procedures performed Allergies: Patient has no known allergies.   Assessment / Plan:     Visit Diagnoses: Chronic gout involving toe of left foot without tophus,  unspecified cause - Plan: allopurinol (ZYLOPRIM) 100 MG tablet, colchicine 0.6 MG tablet  Not clear if the persistent swelling and inflammation around the hand and foot are both due to gout but certainly current left first MTP symptoms are highly consistent for gout.  Recommend starting allopurinol 100 mg daily for urate lowering therapy.  Recommend colchicine take 0.3 mg daily for anti-inflammatory effect.  We will need to recheck response to treatment and therapeutic drug monitoring at 1 month.  Congestive heart failure, unspecified HF chronicity, unspecified heart failure type [facility])  He has congestive heart failure on several medications including carvedilol, atorvastatin, and furosemide for congestive heart failure with edema.  Will prescribe colchicine at low-dose due to carvedilol medication interaction.  His gout is likely precipitated or contributed to by a loop diuretic treatment so we will definitely recommend urate lowering therapy.  Orders: No orders of the defined types were placed in this encounter.  Meds ordered this encounter  Medications  allopurinol (ZYLOPRIM) 100 MG tablet    Sig: Take 1 tablet (100 mg total) by mouth daily.    Dispense:  30 tablet    Refill:  0   colchicine 0.6 MG tablet    Sig: Take 0.5 tablets (0.3 mg total) by mouth daily.    Dispense:  30 tablet    Refill:  0      Follow-Up Instructions: Return in about 4 weeks (around 02/10/2021) for New pt f/u gout allopurinol+colchicine start.   Collier Salina, MD  Note - This record has been created using [employer].  Chart creation errors have been sought, but may not always  have been located. Such creation errors do not reflect on  the standard of medical care.

## 2021-01-28 ENCOUNTER — Telehealth: Payer: Self-pay | Admitting: Radiology

## 2021-01-28 DIAGNOSIS — M109 Gout, unspecified: Secondary | ICD-10-CM

## 2021-01-28 NOTE — Telephone Encounter (Signed)
Spoke with patient's mother, unfortunately they are unable to afford Colchicine using Good[employer]. Are there any alternative routes we can try since medication coverage was denied? Please advise.

## 2021-01-28 NOTE — Telephone Encounter (Signed)
Spoke with [employer] and prior authorization (PA #: [id number]) for Colchicine has been denied.   Attempted to contact patient / patient's mother to advise with the use of GoodRx medication can be filled at [facility] or [facility] for approximately $20. LVM advising patient to call the office to discuss.

## 2021-01-29 MED ORDER — PREDNISONE 10 MG PO TABS
ORAL_TABLET | ORAL | 0 refills | Status: AC
Start: 2021-01-29 — End: 2021-02-12

## 2021-01-29 NOTE — Telephone Encounter (Signed)
FYI- I spoke with Alan Owens if not using colchicine due to cost, he is not a candidate for NSAIDs due to comorbidities, only short term treatment option for gout flare would be corticosteroids. Discussed short term treatment with prednisone agrees with this plan I discussed watching out for any side effects of worsening fluid retention.

## 2021-02-10 ENCOUNTER — Other Ambulatory Visit: Payer: Self-pay

## 2021-02-10 ENCOUNTER — Ambulatory Visit: Payer: Medicaid Other | Admitting: Internal Medicine

## 2021-02-10 ENCOUNTER — Encounter: Payer: Self-pay | Admitting: Internal Medicine

## 2021-02-10 VITALS — BP 142/98 | HR 96 | Ht 67.0 in | Wt 223.0 lb

## 2021-02-10 DIAGNOSIS — R4189 Other symptoms and signs involving cognitive functions and awareness: Secondary | ICD-10-CM

## 2021-02-10 DIAGNOSIS — I509 Heart failure, unspecified: Secondary | ICD-10-CM

## 2021-02-10 DIAGNOSIS — M1A9XX0 Chronic gout, unspecified, without tophus (tophi): Secondary | ICD-10-CM | POA: Diagnosis not present

## 2021-02-10 NOTE — Progress Notes (Signed)
Office Visit Note  Patient: Alan Owens             Date of Birth: 02-Apr-1981           MRN: [id number]             PCP: Nicolette Bang, MD Referring: Caryl Never* Visit Date: 02/10/2021   Subjective:  History of Present Illness: Alan Owens is a 40 y.o. male here for follow up for gout after initial visit last month started allopurinol 100 mg PO daily. He was not able to afford colchicine as initially prescribed and treated with a short prednisone prescription for the joint pain and swelling. Currently swelling and pain seems to be improved, taking prednisone in the evening due to foot pain interfering with sleep in particular. Mr. Coolman is aphasic so history provided by his mother.  Previous HPI: 01/13/21 Alan Owens is a 40 y.o. male here for bilateral hand and foot pain with hyperurecemia. He is nonverbal chronically so history is obtained from his mother and chart review. He experienced increased hand and feet pain with swelling ongoing since around 6 weeks ago. This was initially noticed affecting the left foot especially around the great toe joint with redness and swelling and pain with walking or even laying a sheet or clothing atop the foot. He experienced a good improvement with oral prednisone but symptoms returned after stopping this. He also has pain in his hands with some amount of intermittent swelling reported. She reports it is difficult to tell joint swelling due to some chronic peripheral edema related to heart failure.    Labs reviewed 11/2020 Uric acid 9.8   08/2020 HCV neg Alk phos 131   Imaging reviewed 11/2020 Xray left foot IMPRESSION: 1. Slight hallux valgus. Minimal degenerative change of the first metatarsophalangeal joint and great toe interphalangeal joint. 2. Small Achilles tendon enthesophyte. 3. Subjective osteopenia.   Review of Systems  Constitutional:  Negative for fatigue.  HENT:  Negative for mouth sores,  mouth dryness and nose dryness.   Eyes:  Positive for itching and visual disturbance. Negative for pain and dryness.  Respiratory:  Negative for cough, hemoptysis, shortness of breath and difficulty breathing.   Cardiovascular:  Positive for swelling in legs/feet. Negative for chest pain and palpitations.  Gastrointestinal:  Negative for abdominal pain, blood in stool, constipation and diarrhea.  Endocrine: Negative for increased urination.  Genitourinary:  Negative for painful urination.  Musculoskeletal:  Negative for joint pain, joint pain, joint swelling, myalgias, muscle weakness, morning stiffness, muscle tenderness and myalgias.  Skin:  Negative for color change, rash and redness.  Allergic/Immunologic: Negative for susceptible to infections.  Neurological:  Negative for dizziness, numbness, headaches, memory loss and weakness.  Hematological:  Negative for swollen glands.  Psychiatric/Behavioral:  Negative for confusion and sleep disturbance.    PMFS History:  Patient Active Problem List   Diagnosis Date Noted   Gout 01/13/2021   Prediabetes 11/21/2020   Hypertension    Pneumonia due to COVID-19 virus 07/16/2020   ARF (acute renal failure) ([place]) 07/15/2020   Glaucoma 09/10/2018   Congestive heart failure ([place]) 09/10/2018   Dysphagia 09/10/2018   Mixed hyperlipidemia 09/10/2018   Cognitive impairment 09/10/2018   Skin picking habit 09/10/2018    Past Medical History:  Diagnosis Date   Acid reflux    Blind left eye    Congestive heart failure ([place])    Hypercholesterolemia    Hypertension    Juvenile glaucoma  Mental developmental delay    Renal failure    Vitamin D deficiency     Family History  Problem Relation Age of Onset   Emphysema Mother    Anemia Sister    Hypertension Brother    Hypertension Brother    Diabetes Brother    Multiple sclerosis Maternal Grandmother    Hypertension Maternal Grandmother    Emphysema Maternal Grandfather    Lupus Maternal  Uncle    Gout Maternal Uncle    Colon cancer Neg Hx    Esophageal cancer Neg Hx    Stomach cancer Neg Hx    Rectal cancer Neg Hx    Past Surgical History:  Procedure Laterality Date   MULTIPLE TOOTH EXTRACTIONS Bilateral 2009   all teeth extracted per patient's mother   Social History   Social History Narrative   Not on file   Immunization History  Administered Date(s) Administered   Tdap 07/13/2015     Objective: Vital Signs: BP (!) 142/98 (BP Location: Right Arm, Patient Position: Sitting, Cuff Size: Large)   Pulse 96   Ht $R'5\' 7"'PE$  (1.702 m)   Wt 223 lb (101.2 kg)   BMI 34.93 kg/m    Physical Exam Skin:    General: Skin is warm and dry.     Comments: Chronic hypopigmented areas of linear excoriations worst on right arm  Neurological:     Mental Status: He is alert.     Comments: Aphasic     Musculoskeletal Exam:  Elbows full ROM no tenderness or swelling Wrists full ROM no tenderness or swelling Fingers full ROM bilaterally, left MCPs faintly erythematous especially over 4th MCP Knees full ROM no tenderness or swelling Ankles full ROM no tenderness or swelling MTPs full ROM no tenderness or swelling, no distal foot swelling or erythema   Investigation: No additional findings.  Imaging: No results found.  Recent Labs: Lab Results  Component Value Date   WBC 6.8 07/21/2020   HGB 11.4 (L) 07/21/2020   PLT 258 07/21/2020   NA 142 08/14/2020   K 4.0 08/14/2020   CL 103 08/14/2020   CO2 21 08/14/2020   GLUCOSE 105 (H) 08/14/2020   BUN 12 08/14/2020   CREATININE 1.55 (H) 08/14/2020   BILITOT 0.7 08/14/2020   ALKPHOS 131 (H) 08/14/2020   AST 9 08/14/2020   ALT 11 08/14/2020   PROT 7.1 08/14/2020   ALBUMIN 4.3 08/14/2020   CALCIUM 9.9 08/14/2020   GFRAA 63 08/14/2020    Speciality Comments: No specialty comments available.  Procedures:  No procedures performed Allergies: Patient has no known allergies.   Assessment / Plan:     Visit Diagnoses:  Chronic gout involving toe of left foot without tophus, unspecified cause  Now on allopurinol 100 mg daily for 1 month rechecking uric acid today with goal under 6.0. I anticipate needing further titration. His inflammation is down greatly especially left foot with the prednisone treatment. I recommend seeing if he can tolerate stopping this after the initial course with increased risks of diabetes and CHF, but can restart at low dose if immediate exacerbation.  Cognitive impairment  Patient is impaired and aphasic so difficult to discern exact level of symptoms, mother reports amount of foot pain at night was main limiting factor and seems improved on the prednisone.  Congestive heart failure, unspecified HF chronicity, unspecified heart failure type ([place])  No peripheral edema on exam today, stable on 20 mg daily lasix dose.  Orders: Orders Placed This Encounter  Procedures   Uric acid   COMPLETE METABOLIC PANEL WITH GFR    No orders of the defined types were placed in this encounter.    Follow-Up Instructions: No follow-ups on file.   Collier Salina, MD  Note - This record has been created using [employer].  Chart creation errors have been sought, but may not always  have been located. Such creation errors do not reflect on  the standard of medical care.

## 2021-02-11 LAB — COMPLETE METABOLIC PANEL WITH GFR
AG Ratio: 1.5 (calc) (ref 1.0–2.5)
ALT: 10 U/L (ref 9–46)
AST: 9 U/L — ABNORMAL LOW (ref 10–40)
Albumin: 4.6 g/dL (ref 3.6–5.1)
Alkaline phosphatase (APISO): 124 U/L (ref 36–130)
BUN: 14 mg/dL (ref 7–25)
CO2: 26 mmol/L (ref 20–32)
Calcium: 10.5 mg/dL — ABNORMAL HIGH (ref 8.6–10.3)
Chloride: 103 mmol/L (ref 98–110)
Creat: 1.19 mg/dL (ref 0.60–1.29)
Globulin: 3 g/dL (calc) (ref 1.9–3.7)
Glucose, Bld: 88 mg/dL (ref 65–99)
Potassium: 4 mmol/L (ref 3.5–5.3)
Sodium: 139 mmol/L (ref 135–146)
Total Bilirubin: 0.6 mg/dL (ref 0.2–1.2)
Total Protein: 7.6 g/dL (ref 6.1–8.1)
eGFR: 78 mL/min/{1.73_m2} (ref >=60)

## 2021-02-11 LAB — URIC ACID: Uric Acid, Serum: 6.6 mg/dL (ref 4.0–8.0)

## 2021-02-11 MED ORDER — ALLOPURINOL 100 MG PO TABS: 200.0000 mg | ORAL_TABLET | Freq: Every day | ORAL | 2 refills | Status: DC

## 2021-02-11 NOTE — Addendum Note (Signed)
Addended by: Fuller Plan on: 02/11/2021 09:41 AM   Modules accepted: Orders

## 2021-02-11 NOTE — Progress Notes (Signed)
Uric acid is 6.6 which is improvement, recommend increase to 200 mg allopurinol (2 tablets) daily. Alan Owens's kidney function numbers look all the way back to normal compared to at his hospital stay 6 months ago. I recommend repeat uric acid lab test in 1 month after increasing allopurinol.

## 2021-03-08 ENCOUNTER — Other Ambulatory Visit: Payer: Self-pay | Admitting: Cardiovascular Disease

## 2021-04-15 ENCOUNTER — Ambulatory Visit: Payer: Medicaid Other | Admitting: Cardiovascular Disease

## 2021-04-22 ENCOUNTER — Other Ambulatory Visit: Payer: Self-pay | Admitting: Cardiovascular Disease

## 2021-04-22 MED ORDER — CARVEDILOL 6.25 MG PO TABS: 6.2500 mg | ORAL_TABLET | Freq: Two times a day (BID) | ORAL | 0 refills | Status: DC

## 2021-04-22 MED ORDER — AMLODIPINE BESYLATE 2.5 MG PO TABS: 2.5000 mg | ORAL_TABLET | Freq: Every day | ORAL | 0 refills | Status: DC

## 2021-04-22 MED ORDER — POTASSIUM CHLORIDE ER 20 MEQ PO TBCR: 20.0000 meq | EXTENDED_RELEASE_TABLET | Freq: Every day | ORAL | 0 refills | Status: DC

## 2021-04-24 ENCOUNTER — Other Ambulatory Visit: Payer: Self-pay

## 2021-04-24 MED ORDER — POTASSIUM CHLORIDE ER 20 MEQ PO TBCR: 20.0000 meq | EXTENDED_RELEASE_TABLET | Freq: Every day | ORAL | 1 refills | Status: DC

## 2021-04-29 ENCOUNTER — Other Ambulatory Visit: Payer: Self-pay | Admitting: *Deleted

## 2021-04-29 DIAGNOSIS — K219 Gastro-esophageal reflux disease without esophagitis: Secondary | ICD-10-CM

## 2021-04-29 MED ORDER — OMEPRAZOLE 40 MG PO CPDR: 40.0000 mg | DELAYED_RELEASE_CAPSULE | Freq: Every day | ORAL | 3 refills | Status: DC

## 2021-05-12 ENCOUNTER — Other Ambulatory Visit: Payer: Self-pay | Admitting: Cardiovascular Disease

## 2021-05-13 NOTE — Telephone Encounter (Signed)
Spoke with pharmacy who requested we resend the rx over so they can take a picture of it for their system.   Rx(s) sent to pharmacy electronically.

## 2021-05-15 ENCOUNTER — Other Ambulatory Visit: Payer: Self-pay

## 2021-05-15 MED ORDER — AMLODIPINE BESYLATE 2.5 MG PO TABS: 2.5000 mg | ORAL_TABLET | Freq: Every day | ORAL | 0 refills | Status: DC

## 2021-05-19 ENCOUNTER — Other Ambulatory Visit: Payer: Self-pay | Admitting: Cardiovascular Disease

## 2021-05-19 ENCOUNTER — Other Ambulatory Visit: Payer: Self-pay | Admitting: Internal Medicine

## 2021-05-19 DIAGNOSIS — I509 Heart failure, unspecified: Secondary | ICD-10-CM

## 2021-05-19 DIAGNOSIS — M1A9XX0 Chronic gout, unspecified, without tophus (tophi): Secondary | ICD-10-CM

## 2021-05-19 NOTE — Telephone Encounter (Signed)
Next Visit: No follow up on file  Last Visit: 02/10/2021  Last Fill: 02/11/2021  DX: Chronic gout involving toe of left foot without tophus, unspecified cause  Current Dose per office note 02/10/2021: recommend increase to 200 mg allopurinol (2 tablets) daily  Labs: 02/10/2021 Uric acid is 6.6 which is improvement  Okay to refill Allopurinol? When should patient have a follow up appointment?

## 2021-05-19 NOTE — Telephone Encounter (Signed)
Okay to refill, should schedule by sometime February for 6 months f/u.

## 2021-05-30 ENCOUNTER — Other Ambulatory Visit: Payer: Self-pay | Admitting: Cardiovascular Disease

## 2021-06-01 NOTE — Telephone Encounter (Signed)
This is a [facility] pt.  °

## 2021-06-06 ENCOUNTER — Other Ambulatory Visit: Payer: Self-pay | Admitting: Cardiovascular Disease

## 2021-06-06 ENCOUNTER — Other Ambulatory Visit: Payer: Self-pay | Admitting: Internal Medicine

## 2021-06-06 DIAGNOSIS — I509 Heart failure, unspecified: Secondary | ICD-10-CM

## 2021-06-08 ENCOUNTER — Other Ambulatory Visit: Payer: Self-pay | Admitting: *Deleted

## 2021-06-08 DIAGNOSIS — I509 Heart failure, unspecified: Secondary | ICD-10-CM

## 2021-06-08 MED ORDER — FUROSEMIDE 20 MG PO TABS: 20.0000 mg | ORAL_TABLET | Freq: Every day | ORAL | 0 refills | Status: DC

## 2021-06-08 MED ORDER — CARVEDILOL 6.25 MG PO TABS: 6.2500 mg | ORAL_TABLET | Freq: Two times a day (BID) | ORAL | 0 refills | Status: DC

## 2021-06-11 ENCOUNTER — Other Ambulatory Visit: Payer: Self-pay | Admitting: Internal Medicine

## 2021-06-17 ENCOUNTER — Ambulatory Visit: Payer: Medicaid Other | Admitting: Family Medicine

## 2021-06-22 ENCOUNTER — Ambulatory Visit: Payer: Medicaid Other | Admitting: Family Medicine

## 2021-06-22 ENCOUNTER — Other Ambulatory Visit: Payer: Self-pay | Admitting: Cardiovascular Disease

## 2021-06-26 NOTE — Progress Notes (Deleted)
Date:  06/26/2021   ID:  Alan Owens 22-Sep-1980, MRN [id number]   PCP:  Georganna Skeans, MD  Cardiologist: Eden Emms Electrophysiologist:  None   History of Present Illness:    Alan Owens is a 40 y.o. male has a history of congenital heart disease. 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 Needs aid for feeding. Mother cares for him but known little history. Moved here from [place]. On Medicaid. On both HTN and HLD medicines.   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 Echo was updated 08/09/18  and EF was 45 to 50%. He was continued on medical management.    Mom and patient leary of getting vaccine Educated.  Gout foot used allopurinol and steroids sees Rheumatology Dr Dimple Casey  ***  Past Medical History:  Diagnosis Date   Acid reflux    Blind left eye    Congestive heart failure ([facility])    Hypercholesterolemia    Hypertension    Juvenile glaucoma    Mental developmental delay    Renal failure    Vitamin D deficiency    Past Surgical History:  Procedure Laterality Date   MULTIPLE TOOTH EXTRACTIONS Bilateral 2009   all teeth extracted per patient's mother     No outpatient medications have been marked as taking for the 07/08/21 encounter (Appointment) with Wendall Stade, MD.     Allergies:   Patient has no known allergies.   Social History   Tobacco Use   Smoking status: Never   Smokeless tobacco: Never  Vaping Use   Vaping Use: Never used  Substance Use Topics   Alcohol use: Never   Drug use: Never     Family Hx: The patient's family history includes Anemia in his sister; Diabetes in his brother; Emphysema in his maternal grandfather and mother; Gout in his maternal uncle; Hypertension in his brother, brother, and maternal grandmother; Lupus in his maternal uncle; Multiple sclerosis in his maternal grandmother. There is no history of Colon cancer, Esophageal cancer,  Stomach cancer, or Rectal cancer.  ROS:   Please see the history of present illness.   All other systems reviewed are negative.    Objective:    Vital Signs:  There were no vitals taken for this visit.   Wt Readings from Last 3 Encounters:  02/10/21 223 lb (101.2 kg)  01/13/21 223 lb (101.2 kg)  09/04/20 221 lb (100.2 kg)    Overweight black male  Non verbal  HEENT: normal Neck supple with no adenopathy JVP normal no bruits no thyromegaly Lungs clear with no wheezing and good diaphragmatic motion Heart:  S1/S2 no murmur, no rub, gallop or click PMI increased  Abdomen: benighn, BS positve, no tenderness, no AAA no bruit.  No HSM or HJR Distal pulses intact with no bruits No edema Neuro non-focal Skin warm and dry No muscular weakness   Labs/Other Tests and Data Reviewed:    Lab Results  Component Value Date   WBC 6.8 07/21/2020   HGB 11.4 (L) 07/21/2020   HCT 34.5 (L) 07/21/2020   PLT 258 07/21/2020   GLUCOSE 88 02/10/2021   CHOL 223 (H) 08/14/2020   TRIG 228 (H) 08/14/2020   HDL 38 (L) 08/14/2020   LDLCALC 144 (H) 08/14/2020   ALT 10 02/10/2021   AST 9 (L) 02/10/2021   NA 139 02/10/2021   K 4.0 02/10/2021  CL 103 02/10/2021   CREATININE 1.19 02/10/2021   BUN 14 02/10/2021   CO2 26 02/10/2021   TSH 2.030 12/12/2019   HGBA1C 5.7 (A) 11/19/2020      Prior CV studies:    The following studies were reviewed today:   ECHO IMPRESSIONS 07/2018   1. The left ventricle appears to be normal in size, have normal wall  thickness, with mildly reduced systolic function of 45-50%. Echo evidence  of normal in diastolic filling patterns.   2. LV global hypokinesis.   3. The right ventricle is normal in size, has normal wall thickness and  normal systolic function.   4. Normal left atrial size.   5. Normal right atrial size.   6. The mitral valve normal in structure and function.   7. Normal tricuspid valve.   8. Aortic valve tricuspid.   9. Pulmonic valve  regurgitation is mild by color flow Doppler.  10. The ascending aorta and aortic rootare normal is size and structure.  11. No atrial level shunt detected by color flow Doppler.    ECG:  SR rate 91 normal 12/12/19    ASSESSMENT & PLAN:     1. Dilated CM - doing well with current meds and low sodium diet EF 45-50% by echo January 2020 Update TTE continue coreg/lasix Not on ACE/ARB/ARNI with history of renal failure Cr as high as 3.7  most recent 02/10/21 1.19   3 HTN - BP is ok - medicines refilled   4. HLD - on statin - needs lab work   5. Gout:  on allopurinol left foot improved f/u Dr Dimple Casey    Medication Adjustments/Labs and Tests Ordered: Current medicines are reviewed at length with the patient today.  Concerns regarding medicines are outlined above.   Tests Ordered:  Echo for DCM   Medication Changes: No orders of the defined types were placed in this encounter.   Disposition:  FU with [place] in a year   Patient is agreeable to this plan and will call if any problems develop in the interim.   Signed,  Charlton Haws, MD  06/26/2021 10:38 AM    [facility]

## 2021-06-29 ENCOUNTER — Telehealth: Payer: Self-pay | Admitting: Cardiovascular Disease

## 2021-06-29 NOTE — Telephone Encounter (Signed)
Patient needs appointment changed to [facility] office so transportation is covered. Changed appointments for him.

## 2021-06-29 NOTE — Telephone Encounter (Signed)
° °  Pt's Mom calling, she said they were told by medicare transportation that we need to send them [id number] form so that they can tae pt to his appt

## 2021-07-01 ENCOUNTER — Other Ambulatory Visit: Payer: Self-pay | Admitting: Internal Medicine

## 2021-07-01 ENCOUNTER — Other Ambulatory Visit: Payer: Self-pay | Admitting: Cardiovascular Disease

## 2021-07-01 DIAGNOSIS — M1A9XX0 Chronic gout, unspecified, without tophus (tophi): Secondary | ICD-10-CM

## 2021-07-01 DIAGNOSIS — I509 Heart failure, unspecified: Secondary | ICD-10-CM

## 2021-07-01 NOTE — Telephone Encounter (Signed)
Next Visit: 08/13/2021  Last Visit: 02/10/2021  Last Fill: 05/19/2021  DX: Chronic gout involving toe of left foot without tophus, unspecified cause  Current Dose per lab note 02/10/2021: recommend increase to 200 mg allopurinol (2 tablets) daily  Labs: 8/2/20222 Uric acid is 6.6 which is improvement, recommend increase to 200 mg allopurinol (2 tablets) daily.   Okay to refill Allopurinol?

## 2021-07-06 ENCOUNTER — Other Ambulatory Visit: Payer: Self-pay | Admitting: Internal Medicine

## 2021-07-06 DIAGNOSIS — I509 Heart failure, unspecified: Secondary | ICD-10-CM

## 2021-07-08 ENCOUNTER — Ambulatory Visit: Payer: Medicaid Other | Admitting: Cardiovascular Disease

## 2021-07-12 ENCOUNTER — Other Ambulatory Visit: Payer: Self-pay | Admitting: Internal Medicine

## 2021-07-12 DIAGNOSIS — I509 Heart failure, unspecified: Secondary | ICD-10-CM

## 2021-07-22 NOTE — Progress Notes (Signed)
Date:  07/31/2021   ID:  Alan Owens, Alan Owens 1980-09-09, MRN [id number]   PCP:  Alan Mai, MD  Cardiologist: Alan Owens Electrophysiologist:  None   History of Present Illness:    Alan Owens is a 40 y.o. male has a history of congenital heart disease. 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 Needs aid for feeding. Mother cares for him but known little history. Moved here from [place]. On Medicaid. On both HTN and HLD medicines.   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 Echo was updated 08/09/18  and EF was 45 to 50%. He was continued on medical management.    Gout foot used allopurinol and steroids sees Rheumatology Dr Alan Owens  He walks a lot and has a tablet for entertainment Also has a dog at home   Past Medical History:  Diagnosis Date   Acid reflux    Blind left eye    Congestive heart failure ([place])    Hypercholesterolemia    Hypertension    Juvenile glaucoma    Mental developmental delay    Renal failure    Vitamin D deficiency    Past Surgical History:  Procedure Laterality Date   MULTIPLE TOOTH EXTRACTIONS Bilateral 2009   all teeth extracted per patient's mother     Current Meds  Medication Sig   allopurinol (ZYLOPRIM) 100 MG tablet TAKE 1 TABLET BY MOUTH EVERY DAY   ALPHAGAN P 0.1 % SOLN Apply 1 drop to eye in the morning and at bedtime.   amLODipine (NORVASC) 2.5 MG tablet Take 1 tablet (2.5 mg total) by mouth daily. Please keep upcoming appt in January 2023 with Dr. Johnsie Owens before anymore refills. Thank you Final Attempt   ascorbic acid (VITAMIN C) 500 MG tablet Take 1 tablet (500 mg total) by mouth daily.   ASPIRIN LOW DOSE 81 MG EC tablet TAKE 1 TABLET BY MOUTH EVERY DAY   atorvastatin (LIPITOR) 20 MG tablet TAKE 1 TABLET (20 MG TOTAL) BY MOUTH DAILY AT 6 PM.   carvedilol (COREG) 6.25 MG tablet Take 1 tablet (6.25 mg total) by mouth 2 (two) times daily with a meal. Please  keep upcoming appt in January 2023 with Dr. Johnsie Owens before anymore refills. Thank you Final Attempt   furosemide (LASIX) 20 MG tablet Take 1 tablet (20 mg total) by mouth daily.   hydrOXYzine (ATARAX) 50 MG tablet TAKE 1 TABLET BY MOUTH EVERY DAY AT NIGHT   Ipratropium-Albuterol (COMBIVENT) 20-100 MCG/ACT AERS respimat Inhale 2 puffs into the lungs 2 (two) times daily. Use 2 times daily x 1 week, then as needed.   latanoprost (XALATAN) 0.005 % ophthalmic solution Place 1 drop into both eyes at bedtime.   omeprazole (PRILOSEC) 40 MG capsule Take 1 capsule (40 mg total) by mouth daily.   Potassium Chloride ER 20 MEQ TBCR TAKE 1 TABLET BY MOUTH EVERY DAY   zinc sulfate 220 (50 Zn) MG capsule Take 1 capsule (220 mg total) by mouth daily.     Allergies:   Patient has no known allergies.   Social History   Tobacco Use   Smoking status: Never   Smokeless tobacco: Never  Vaping Use   Vaping Use: Never used  Substance Use Topics   Alcohol use: Never   Drug use: Never     Family Hx: The patient's family history includes Anemia in his sister; Diabetes  in his brother; Emphysema in his maternal grandfather and mother; Gout in his maternal uncle; Hypertension in his brother, brother, and maternal grandmother; Lupus in his maternal uncle; Multiple sclerosis in his maternal grandmother. There is no history of Colon cancer, Esophageal cancer, Stomach cancer, or Rectal cancer.  ROS:   Please see the history of present illness.   All other systems reviewed are negative.    Objective:    Vital Signs:  BP (!) 166/104    Pulse 84    Ht 5\' 7"  (1.702 m)    Wt 200 lb (90.7 kg)    SpO2 98%    BMI 31.32 kg/m    Wt Readings from Last 3 Encounters:  07/31/21 200 lb (90.7 kg)  07/23/21 200 lb (90.7 kg)  02/10/21 223 lb (101.2 kg)    Overweight black male  Non verbal  HEENT: normal Neck supple with no adenopathy JVP normal no bruits no thyromegaly Lungs clear with no wheezing and good diaphragmatic  motion Heart:  S1/S2 no murmur, no rub, gallop or click PMI increased  Abdomen: benighn, BS positve, no tenderness, no AAA no bruit.  No HSM or HJR Distal pulses intact with no bruits No edema Neuro non-focal Skin warm and dry No muscular weakness   Labs/Other Tests and Data Reviewed:    Lab Results  Component Value Date   WBC 6.8 07/21/2020   HGB 11.4 (L) 07/21/2020   HCT 34.5 (L) 07/21/2020   PLT 258 07/21/2020   GLUCOSE 88 02/10/2021   CHOL 223 (H) 08/14/2020   TRIG 228 (H) 08/14/2020   HDL 38 (L) 08/14/2020   LDLCALC 144 (H) 08/14/2020   ALT 10 02/10/2021   AST 9 (L) 02/10/2021   NA 139 02/10/2021   K 4.0 02/10/2021   CL 103 02/10/2021   CREATININE 1.19 02/10/2021   BUN 14 02/10/2021   CO2 26 02/10/2021   TSH 2.030 12/12/2019   HGBA1C 5.7 (A) 11/19/2020      Prior CV studies:    The following studies were reviewed today:   ECHO IMPRESSIONS 07/2018   1. The left ventricle appears to be normal in size, have normal wall  thickness, with mildly reduced systolic function of [id number]. Echo evidence  of normal in diastolic filling patterns.   2. LV global hypokinesis.   3. The right ventricle is normal in size, has normal wall thickness and  normal systolic function.   4. Normal left atrial size.   5. Normal right atrial size.   6. The mitral valve normal in structure and function.   7. Normal tricuspid valve.   8. Aortic valve tricuspid.   9. Pulmonic valve regurgitation is mild by color flow Doppler.  10. The ascending aorta and aortic rootare normal is size and structure.  11. No atrial level shunt detected by color flow Doppler.    ECG:  SR rate 91 normal 12/12/19 07/31/2021 SR rate 84 LVH no changes    ASSESSMENT & PLAN:     1. Dilated CM - doing well with current meds and low sodium diet EF 45-50% by echo January 2020 Update TTE continue coreg/lasix Not on ACE/ARB/ARNI with history of renal failure Cr as high as 3.7  most recent 02/10/21 1.19   3 HTN -  BP is ok - medicines refilled   4. HLD - on statin - needs lab work   5. Gout:  on allopurinol left foot improved f/u Dr Alan Owens Couldn't afford colchicine on steroids wean per  Rheumatology     Medication Adjustments/Labs and Tests Ordered: Current medicines are reviewed at length with the patient today.  Concerns regarding medicines are outlined above.   Tests Ordered:  Echo for DCM   Medication Changes: No orders of the defined types were placed in this encounter.    Disposition:  FU with [place] in a year   Patient is agreeable to this plan and will call if any problems develop in the interim.   Signed,  Jenkins Rouge, MD  07/31/2021 8:51 AM    [place]

## 2021-07-23 ENCOUNTER — Other Ambulatory Visit: Payer: Self-pay

## 2021-07-23 ENCOUNTER — Encounter: Payer: Self-pay | Admitting: Family Medicine

## 2021-07-23 ENCOUNTER — Ambulatory Visit (INDEPENDENT_AMBULATORY_CARE_PROVIDER_SITE_OTHER): Payer: Medicaid Other | Admitting: Family Medicine

## 2021-07-23 VITALS — BP 115/115 | HR 84 | Resp 16 | Wt 200.0 lb

## 2021-07-23 DIAGNOSIS — E782 Mixed hyperlipidemia: Secondary | ICD-10-CM | POA: Diagnosis not present

## 2021-07-23 DIAGNOSIS — R7303 Prediabetes: Secondary | ICD-10-CM

## 2021-07-23 DIAGNOSIS — I10 Essential (primary) hypertension: Secondary | ICD-10-CM

## 2021-07-23 DIAGNOSIS — F424 Excoriation (skin-picking) disorder: Secondary | ICD-10-CM

## 2021-07-23 MED ORDER — ATORVASTATIN CALCIUM 20 MG PO TABS: ORAL_TABLET | ORAL | 3 refills | Status: DC

## 2021-07-23 MED ORDER — HYDROXYZINE HCL 50 MG PO TABS: ORAL_TABLET | ORAL | 5 refills | Status: DC

## 2021-07-23 NOTE — Progress Notes (Signed)
Established  Patient Office Visit  Subjective:  Patient ID: Alan Owens, male    DOB: [birth year]  Age: 41 y.o. MRN: [id number]  CC:  Chief Complaint  Patient presents with   Follow-up    HPI Alan Owens presents for routine follow up of chronic med issues including hypertension.   Past Medical History:  Diagnosis Date   Acid reflux    Blind left eye    Congestive heart failure (HCC)    Hypercholesterolemia    Hypertension    Juvenile glaucoma    Mental developmental delay    Renal failure    Vitamin D deficiency     Past Surgical History:  Procedure Laterality Date   MULTIPLE TOOTH EXTRACTIONS Bilateral 2009   all teeth extracted per patient's mother    Family History  Problem Relation Age of Onset   Emphysema Mother    Anemia Sister    Hypertension Brother    Hypertension Brother    Diabetes Brother    Multiple sclerosis Maternal Grandmother    Hypertension Maternal Grandmother    Emphysema Maternal Grandfather    Lupus Maternal Uncle    Gout Maternal Uncle    Colon cancer Neg Hx    Esophageal cancer Neg Hx    Stomach cancer Neg Hx    Rectal cancer Neg Hx     Social History   Socioeconomic History   Marital status: Single    Spouse name: Not on file   Number of children: Not on file   Years of education: Not on file   Highest education level: Not on file  Occupational History   Not on file  Tobacco Use   Smoking status: Never   Smokeless tobacco: Never  Vaping Use   Vaping Use: Never used  Substance and Sexual Activity   Alcohol use: Never   Drug use: Never   Sexual activity: Never  Other Topics Concern   Not on file  Social History Narrative   Not on file   Social Determinants of Health   Financial Resource Strain: Not on file  Food Insecurity: Not on file  Transportation Needs: Not on file  Physical Activity: Not on file  Stress: Not on file  Social Connections: Not on file  Intimate Partner Violence: Not on file     ROS Review of Systems  Objective:   Today's Vitals: BP (!) 115/115    Pulse 84    Resp 16    Wt 200 lb (90.7 kg)    SpO2 98%    BMI 31.32 kg/m   Physical Exam Vitals and nursing note reviewed.  Constitutional:      General: He is not in acute distress. Cardiovascular:     Rate and Rhythm: Normal rate and regular rhythm.  Pulmonary:     Effort: Pulmonary effort is normal.     Breath sounds: Normal breath sounds.  Abdominal:     Palpations: Abdomen is soft.     Tenderness: There is no abdominal tenderness.  Neurological:     General: No focal deficit present.     Mental Status: He is alert and oriented to person, place, and time.    Assessment & Plan:  1. Hypertension, unspecified type Slightly elevated reading. Continue present management and monitor.   2. Prediabetes Continue present management.   3. Mixed hyperlipidemia Continue present management. Meds refilled.   4. Skin picking habit Hydroxyzine refilled.    Outpatient Encounter Medications as of 07/23/2021  Medication  Sig   allopurinol (ZYLOPRIM) 100 MG tablet TAKE 1 TABLET BY MOUTH EVERY DAY   ALPHAGAN P 0.1 % SOLN Apply 1 drop to eye in the morning and at bedtime.   amLODipine (NORVASC) 2.5 MG tablet Take 1 tablet (2.5 mg total) by mouth daily. Please keep upcoming appt in January 2023 with Dr. Eden Emms before anymore refills. Thank you Final Attempt   ascorbic acid (VITAMIN C) 500 MG tablet Take 1 tablet (500 mg total) by mouth daily.   ASPIRIN LOW DOSE 81 MG EC tablet TAKE 1 TABLET BY MOUTH EVERY DAY   atorvastatin (LIPITOR) 20 MG tablet TAKE 1 TABLET (20 MG TOTAL) BY MOUTH DAILY AT 6 PM.   carvedilol (COREG) 6.25 MG tablet Take 1 tablet (6.25 mg total) by mouth 2 (two) times daily with a meal. Please keep upcoming appt in January 2023 with Dr. Eden Emms before anymore refills. Thank you Final Attempt   furosemide (LASIX) 20 MG tablet Take 1 tablet (20 mg total) by mouth daily.   hydrOXYzine (ATARAX/VISTARIL)  50 MG tablet TAKE 1 TABLET BY MOUTH EVERY DAY AT NIGHT   Ipratropium-Albuterol (COMBIVENT) 20-100 MCG/ACT AERS respimat Inhale 2 puffs into the lungs 2 (two) times daily. Use 2 times daily x 1 week, then as needed.   latanoprost (XALATAN) 0.005 % ophthalmic solution Place 1 drop into both eyes at bedtime.   omeprazole (PRILOSEC) 40 MG capsule Take 1 capsule (40 mg total) by mouth daily.   Potassium Chloride ER 20 MEQ TBCR TAKE 1 TABLET BY MOUTH EVERY DAY   zinc sulfate 220 (50 Zn) MG capsule Take 1 capsule (220 mg total) by mouth daily.   ALPHAGAN P 0.15 % ophthalmic solution 1 drop 2 (two) times daily.   No facility-administered encounter medications on file as of 07/23/2021.    Follow-up: No follow-ups on file.   [name], MD

## 2021-07-25 ENCOUNTER — Encounter: Payer: Self-pay | Admitting: Family Medicine

## 2021-07-31 ENCOUNTER — Other Ambulatory Visit: Payer: Self-pay

## 2021-07-31 ENCOUNTER — Encounter: Payer: Self-pay | Admitting: Cardiovascular Disease

## 2021-07-31 ENCOUNTER — Ambulatory Visit (INDEPENDENT_AMBULATORY_CARE_PROVIDER_SITE_OTHER): Payer: Medicaid Other | Admitting: Cardiovascular Disease

## 2021-07-31 VITALS — BP 166/104 | HR 84 | Ht 67.0 in | Wt 200.0 lb

## 2021-07-31 DIAGNOSIS — I517 Cardiomegaly: Secondary | ICD-10-CM | POA: Diagnosis not present

## 2021-07-31 DIAGNOSIS — I420 Dilated cardiomyopathy: Secondary | ICD-10-CM

## 2021-07-31 NOTE — Patient Instructions (Signed)
Medication Instructions:  °NO CHANGES °*If you need a refill on your cardiac medications before your next appointment, please call your pharmacy* ° ° °Lab Work: °NONE °If you have labs (blood work) drawn today and your tests are completely normal, you will receive your results only by: °MyChart Message (if you have MyChart) OR °A paper copy in the mail °If you have any lab test that is abnormal or we need to change your treatment, we will call you to review the results. ° ° °Testing/Procedures: °Your physician has requested that you have an echocardiogram. Echocardiography is a painless test that uses sound waves to create images of your heart. It provides your doctor with information about the size and shape of your heart and how well your heart’s chambers and valves are working. This procedure takes approximately one hour. There are no restrictions for this procedure. ° ° ° °Follow-Up: °At [facility], you and your health needs are our priority.  As part of our continuing mission to provide you with exceptional heart care, we have created designated Provider Care Teams.  These Care Teams include your primary Cardiologist (physician) and Advanced Practice Providers (APPs -  Physician Assistants and Nurse Practitioners) who all work together to provide you with the care you need, when you need it. ° °We recommend signing up for the patient portal called "MyChart".  Sign up information is provided on this After Visit Summary.  MyChart is used to connect with patients for Virtual Visits (Telemedicine).  Patients are able to view lab/test results, encounter notes, upcoming appointments, etc.  Non-urgent messages can be sent to your provider as well.   °To learn more about what you can do with MyChart, go to [email or web address].   ° °Your next appointment:   °1 year(s) ° °The format for your next appointment:   °In Person ° °Provider:   °Peter Nishan, MD   ° ° °Other Instructions °NONE  °

## 2021-08-05 ENCOUNTER — Other Ambulatory Visit: Payer: Self-pay | Admitting: Internal Medicine

## 2021-08-05 ENCOUNTER — Telehealth: Payer: Self-pay | Admitting: Family Medicine

## 2021-08-05 DIAGNOSIS — I509 Heart failure, unspecified: Secondary | ICD-10-CM

## 2021-08-05 NOTE — Telephone Encounter (Signed)
Provider is calling to check on the statue of the request for provider order and certificate of medical to please call them since they send the request over a month ago please call [phone],

## 2021-08-10 ENCOUNTER — Other Ambulatory Visit: Payer: Self-pay | Admitting: Cardiovascular Disease

## 2021-08-13 ENCOUNTER — Ambulatory Visit: Payer: Medicaid Other | Admitting: Internal Medicine

## 2021-08-18 ENCOUNTER — Other Ambulatory Visit: Payer: Self-pay

## 2021-08-18 ENCOUNTER — Ambulatory Visit (HOSPITAL_COMMUNITY): Payer: Medicaid Other | Attending: Cardiology

## 2021-08-18 DIAGNOSIS — I420 Dilated cardiomyopathy: Secondary | ICD-10-CM | POA: Insufficient documentation

## 2021-08-18 LAB — ECHOCARDIOGRAM COMPLETE
Area-P 1/2: 3.58 cm2
P 1/2 time: 384 msec
S' Lateral: 3.6 cm

## 2021-08-21 ENCOUNTER — Telehealth: Payer: Self-pay

## 2021-08-21 ENCOUNTER — Telehealth: Payer: Self-pay | Admitting: Cardiovascular Disease

## 2021-08-21 DIAGNOSIS — I77810 Thoracic aortic ectasia: Secondary | ICD-10-CM

## 2021-08-21 MED ORDER — AMLODIPINE BESYLATE 2.5 MG PO TABS: 2.5000 mg | ORAL_TABLET | Freq: Every day | ORAL | 3 refills | Status: DC

## 2021-08-21 NOTE — Telephone Encounter (Addendum)
Called patient's sister (DPR) with results. Placed order for CT in one year.  ----- Message ----- From: Wendall Stade, MD Sent: 08/18/2021   1:24 PM EST To: Karl Pock Pinnix, LPN  EF stable mildy decreased Aorta is dilated consider f/u MRI/CT in a year

## 2021-08-21 NOTE — Telephone Encounter (Signed)
°*  STAT* If patient is at the pharmacy, call can be transferred to refill team.   1. Which medications need to be refilled? (please list name of each medication and dose if known) amLODipine (NORVASC) 2.5 MG tablet  2. Which pharmacy/location (including street and city if local pharmacy) is medication to be sent to? Take 1 tablet (2.5 mg total) by mouth daily  3. Do they need a 30 day or 90 day supply? 90  Patient had appt in January

## 2021-08-23 NOTE — Progress Notes (Signed)
Office Visit Note  Patient: Alan Owens             Date of Birth: 1980-12-03           MRN: [id number]             PCP: Alan Mai, MD Referring: Alan Owens* Visit Date: 08/24/2021   Subjective:  Follow-up (Bil hand stiffness)   History of Present Illness: Alan Owens is a 41 y.o. male here for follow up for gout on allopurinol 200 mg daily. His foot pain and swelling has been improved with no major flare ups since our last visit. However he has some bilateral hand swelling and stiffness that is new. This is apparently most noticeable first thing in the morning he is stiff and by the end of the day increased swelling is present. No discoloration or rashes. 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  Previous HPI 02/10/21 Alan Owens is a 41 y.o. male here for follow up for gout after initial visit last month started allopurinol 100 mg PO daily. He was not able to afford colchicine as initially prescribed and treated with a short prednisone prescription for the joint pain and swelling. Currently swelling and pain seems to be improved, taking prednisone in the evening due to foot pain interfering with sleep in particular. 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   Previous HPI: 01/13/21 Alan Owens is a 41 y.o. male here for bilateral hand and foot pain with hyperurecemia. He is nonverbal chronically so history is obtained from his mother and chart review. He experienced increased hand and feet pain with swelling ongoing since around 6 weeks ago. This was initially noticed affecting the left foot especially around the great toe joint with redness and swelling and pain with walking or even laying a sheet or clothing atop the foot. He experienced a good improvement with oral prednisone but symptoms returned after stopping this. He also has pain in his hands with some amount of intermittent swelling  reported. She reports it is difficult to tell joint swelling due to some chronic peripheral edema related to heart failure.    Review of Systems  Constitutional:  Negative for fatigue.  HENT:  Negative for mouth dryness.   Eyes:  Negative for dryness.  Respiratory:  Negative for shortness of breath.   Cardiovascular:  Positive for swelling in legs/feet.  Gastrointestinal:  Negative for constipation.  Endocrine: Positive for excessive thirst.  Genitourinary:  Negative for difficulty urinating.  Musculoskeletal:  Positive for joint pain, gait problem, joint pain, joint swelling and morning stiffness.  Skin:  Negative for rash.  Allergic/Immunologic: Negative for susceptible to infections.  Neurological:  Negative for numbness.  Hematological:  Negative for bruising/bleeding tendency.  Psychiatric/Behavioral:  Positive for sleep disturbance.    PMFS History:  Patient Active Problem List   Diagnosis Date Noted   Bilateral hand swelling 08/24/2021   Gout 01/13/2021   Prediabetes 11/21/2020   Hypertension    Pneumonia due to COVID-19 virus 07/16/2020   ARF (acute renal failure) ([place]) 07/15/2020   Glaucoma 09/10/2018   Congestive heart failure ([place]) 09/10/2018   Dysphagia 09/10/2018   Mixed hyperlipidemia 09/10/2018   Cognitive impairment 09/10/2018   Skin picking habit 09/10/2018    Past Medical History:  Diagnosis Date   Acid reflux    Blind left eye    Congestive heart failure (HCC)    Hypercholesterolemia  Hypertension    Juvenile glaucoma    Mental developmental delay    Renal failure    Vitamin D deficiency     Family History  Problem Relation Age of Onset   Emphysema Mother    Anemia Sister    Hypertension Brother    Hypertension Brother    Diabetes Brother    Multiple sclerosis Maternal Grandmother    Hypertension Maternal Grandmother    Emphysema Maternal Grandfather    Lupus Maternal Uncle    Gout Maternal Uncle    Colon cancer Neg Hx    Esophageal  cancer Neg Hx    Stomach cancer Neg Hx    Rectal cancer Neg Hx    Past Surgical History:  Procedure Laterality Date   MULTIPLE TOOTH EXTRACTIONS Bilateral 2009   all teeth extracted per patient's mother   Social History   Social History Narrative   Not on file   Immunization History  Administered Date(s) Administered   Tdap 07/13/2015     Objective: Vital Signs: BP (!) 168/116 (BP Location: Left Arm, Patient Position: Sitting, Cuff Size: Normal)    Pulse 89    Resp 15    Ht 5' 8.5" (1.74 m)    Wt 200 lb (90.7 kg)    BMI 29.97 kg/m    Physical Exam Cardiovascular:     Rate and Rhythm: Normal rate and regular rhythm.  Pulmonary:     Effort: Pulmonary effort is normal.     Breath sounds: Normal breath sounds.  Musculoskeletal:     Right lower leg: No edema.     Left lower leg: No edema.  Skin:    General: Skin is warm and dry.     Findings: No rash.  Neurological:     Mental Status: He is alert.     Comments: Aphasic   Musculoskeletal Exam:  Elbows full ROM no tenderness or swelling Wrists full ROM no tenderness or swelling Fingers full ROM no tenderness or swelling Knees full ROM no tenderness or swelling Ankles full ROM no tenderness or swelling MTPs no squeeze tenderness   Investigation: No additional findings.  Imaging: ECHOCARDIOGRAM COMPLETE  Result Date: 08/18/2021    ECHOCARDIOGRAM REPORT   Patient Name:   Alan Owens Date of Exam: 08/18/2021 Medical Rec #:  [id number]        Height:       67.0 in Accession #:    [id number]       Weight:       200.0 lb Date of Birth:  1981/02/17        BSA:          2.022 m Patient Age:    8 years         BP:           166/84 mmHg Patient Gender: M                HR:           81 bpm. Exam Location:  [place] Procedure: 2D Echo, Cardiac Doppler and Color Doppler Indications:    I42.0 Dilated cardiomyopathy  History:        Patient has prior history of Echocardiogram examinations, most                 recent 08/09/2018.  Dilated cardiomyopathy; Risk                 Factors:Hypertension and Dyslipidemia.  Sonographer:    Alan Saxon  Owens RDCS Referring Phys: [place]  1. Left ventricular ejection fraction, by estimation, is 45 to 50%. The left ventricle has mildly decreased function. The left ventricle demonstrates global hypokinesis. There is mild concentric left ventricular hypertrophy. Left ventricular diastolic parameters are consistent with Grade I diastolic dysfunction (impaired relaxation).  2. Right ventricular systolic function is normal. The right ventricular size is normal.  3. The mitral valve is normal in structure. Trivial mitral valve regurgitation. No evidence of mitral stenosis.  4. The aortic valve is tricuspid. Aortic valve regurgitation is mild. No aortic stenosis is present.  5. Aneurysm of the aortic root, measuring 46 mm.  6. The inferior vena cava is normal in size with greater than 50% respiratory variability, suggesting right atrial pressure of 3 mmHg. FINDINGS  Left Ventricle: Left ventricular ejection fraction, by estimation, is 45 to 50%. The left ventricle has mildly decreased function. The left ventricle demonstrates global hypokinesis. The left ventricular internal cavity size was normal in size. There is  mild concentric left ventricular hypertrophy. Left ventricular diastolic parameters are consistent with Grade I diastolic dysfunction (impaired relaxation). Right Ventricle: The right ventricular size is normal. No increase in right ventricular wall thickness. Right ventricular systolic function is normal. Left Atrium: Left atrial size was normal in size. Right Atrium: Right atrial size was normal in size. Pericardium: There is no evidence of pericardial effusion. Mitral Valve: The mitral valve is normal in structure. Trivial mitral valve regurgitation. No evidence of mitral valve stenosis. Tricuspid Valve: The tricuspid valve is normal in structure. Tricuspid valve  regurgitation is mild . No evidence of tricuspid stenosis. Aortic Valve: The aortic valve is tricuspid. Aortic valve regurgitation is mild. Aortic regurgitation PHT measures 384 msec. No aortic stenosis is present. Pulmonic Valve: The pulmonic valve was normal in structure. Pulmonic valve regurgitation is mild. No evidence of pulmonic stenosis. Aorta: There is an aneurysm involving the aortic root measuring 46 mm. Venous: The inferior vena cava is normal in size with greater than 50% respiratory variability, suggesting right atrial pressure of 3 mmHg. IAS/Shunts: No atrial level shunt detected by color flow Doppler.  LEFT VENTRICLE PLAX 2D LVIDd:         4.60 cm Diastology LVIDs:         3.60 cm LV e' medial:    12.00 cm/s LV PW:         1.20 cm LV E/e' medial:  5.9 LV IVS:        1.40 cm LV e' lateral:   6.09 cm/s                        LV E/e' lateral: 11.7  RIGHT VENTRICLE             IVC RV S prime:     13.40 cm/s  IVC diam: 1.20 cm TAPSE (M-mode): 1.4 cm LEFT ATRIUM             Index        RIGHT ATRIUM           Index LA diam:        3.10 cm 1.53 cm/m   RA Pressure: 3.00 mmHg LA Vol (A2C):   35.1 ml 17.36 ml/m  RA Area:     15.30 cm LA Vol (A4C):   57.7 ml 28.53 ml/m  RA Volume:   35.90 ml  17.75 ml/m LA Biplane Vol: 47.1 ml 23.29 ml/m  AORTIC VALVE  LVOT Vmax:   65.50 cm/s LVOT Vmean:  50.700 cm/s LVOT VTI:    0.130 m AI PHT:      384 msec  AORTA Ao Root diam: 4.60 cm Ao Asc diam:  3.50 cm MITRAL VALVE               TRICUSPID VALVE MV Area (PHT): 3.58 cm    Estimated RAP:  3.00 mmHg MV Decel Time: 212 msec MV E velocity: 71.30 cm/s  SHUNTS MV A velocity: 94.90 cm/s  Systemic VTI: 0.13 m MV E/A ratio:  0.75 Kardie Tobb DO Electronically signed by Berniece Salines DO Signature Date/Time: 08/18/2021/12:27:44 PM    Final    XR Hand 2 View Left  Result Date: 09/01/2021 Xray left hand 2 views Radiocarpal joint space appears normal. There are probable early MCP and DIP degenerative joint changes with small  marginal cysts and osteophyte. No erosions and normal appearing bone mineralization. Impression Minimal  early OA appearing changes  XR Hand 2 View Right  Result Date: 09/01/2021 Xray right hand 2 views Radiocarpal joint space appears normal. MCP, PIP, and DIP joint spaces appear normal. No erosions and normal appearing bone mineralization. Impression No significant arthritis   Recent Labs: Lab Results  Component Value Date   WBC 6.8 07/21/2020   HGB 11.4 (L) 07/21/2020   PLT 258 07/21/2020   NA 139 02/10/2021   K 4.0 02/10/2021   CL 103 02/10/2021   CO2 26 02/10/2021   GLUCOSE 88 02/10/2021   BUN 14 02/10/2021   CREATININE 1.19 02/10/2021   BILITOT 0.6 02/10/2021   ALKPHOS 131 (H) 08/14/2020   AST 9 (L) 02/10/2021   ALT 10 02/10/2021   PROT 7.6 02/10/2021   ALBUMIN 4.3 08/14/2020   CALCIUM 10.5 (H) 02/10/2021   GFRAA 63 08/14/2020    Speciality Comments: No specialty comments available.  Procedures:  No procedures performed Allergies: Patient has no known allergies.   Assessment / Plan:     Visit Diagnoses: Chronic gout involving toe of left foot without tophus, unspecified cause - Plan: allopurinol (ZYLOPRIM) 100 MG tablet  Uric acid at goal and no major flare ups with gout since last visit. Plan to continue current allopurinol 200 mg daily.  Bilateral hand swelling - Plan: XR Hand 2 View Right, XR Hand 2 View Left  Checking bilateral hand xrays for alternative causes of hand pain and inflammation. There was minimal OA appearing change. I don't think this explains his reported swelling. Could be related to edema he is currently on lasix 20 mg daily and takes amlodipine 2.5 mg daily. I don't see any specific indication needing new intervention right now.  Orders: Orders Placed This Encounter  Procedures   XR Hand 2 View Right   XR Hand 2 View Left   Meds ordered this encounter  Medications   allopurinol (ZYLOPRIM) 100 MG tablet    Sig: Take 2 tablets (200 mg  total) by mouth daily.    Dispense:  180 tablet    Refill:  1     Follow-Up Instructions: Return in about 6 months (around 02/21/2022) for Gout on allopurinol/? hand swelling f/u 54mos.   Collier Salina, MD  Note - This record has been created using [employer].  Chart creation errors have been sought, but may not always  have been located. Such creation errors do not reflect on  the standard of medical care.

## 2021-08-24 ENCOUNTER — Other Ambulatory Visit: Payer: Self-pay

## 2021-08-24 ENCOUNTER — Ambulatory Visit: Payer: Self-pay

## 2021-08-24 ENCOUNTER — Encounter: Payer: Self-pay | Admitting: Internal Medicine

## 2021-08-24 ENCOUNTER — Ambulatory Visit (INDEPENDENT_AMBULATORY_CARE_PROVIDER_SITE_OTHER): Payer: Medicaid Other | Admitting: Internal Medicine

## 2021-08-24 VITALS — BP 168/116 | HR 89 | Resp 15 | Ht 68.5 in | Wt 200.0 lb

## 2021-08-24 DIAGNOSIS — M79642 Pain in left hand: Secondary | ICD-10-CM

## 2021-08-24 DIAGNOSIS — M1A9XX0 Chronic gout, unspecified, without tophus (tophi): Secondary | ICD-10-CM

## 2021-08-24 DIAGNOSIS — M7989 Other specified soft tissue disorders: Secondary | ICD-10-CM | POA: Diagnosis not present

## 2021-08-24 DIAGNOSIS — M79641 Pain in right hand: Secondary | ICD-10-CM | POA: Diagnosis not present

## 2021-08-24 MED ORDER — ALLOPURINOL 100 MG PO TABS: 200.0000 mg | ORAL_TABLET | Freq: Every day | ORAL | 1 refills | Status: AC

## 2021-08-28 ENCOUNTER — Telehealth: Payer: Self-pay | Admitting: Family Medicine

## 2021-08-28 NOTE — Telephone Encounter (Signed)
Mom is asking for a referral for  her son for his spine  to see the same Doctor  she sees- Dr. Asa Lente, MD at [facility] [address]  [place] [address] [phone]

## 2021-09-03 ENCOUNTER — Ambulatory Visit: Payer: Medicaid Other | Admitting: Family Medicine

## 2021-09-07 ENCOUNTER — Ambulatory Visit: Payer: Medicaid Other | Admitting: Cardiovascular Disease

## 2021-10-05 ENCOUNTER — Encounter: Payer: Self-pay | Admitting: Family Medicine

## 2021-10-06 NOTE — Telephone Encounter (Signed)
Paperwork given to PCP to fill out ?

## 2021-10-22 ENCOUNTER — Telehealth: Payer: Self-pay | Admitting: Family Medicine

## 2021-10-22 NOTE — Telephone Encounter (Signed)
Copied from CRM [id number]. Topic: General - Other ?>> Oct 22, 2021  3:28 PM Traci Sermon wrote: ?Reason for CRM: Pts mother called in about the Incontinence and wanted to get an update and wanted to know if the signed forms has been sent back over to them, pt requested a call back, please advise. ?

## 2021-10-22 NOTE — Telephone Encounter (Signed)
Paper work has been faxed

## 2021-10-28 NOTE — Telephone Encounter (Signed)
Alan Owens from [facility] is calling back stated they did not receive paperwork. Alan Owens is requesting that this be re-faxed.   ? ?Fax- [phone] ?Call back - [phone]  ?

## 2021-10-28 NOTE — Telephone Encounter (Signed)
Paperwork has been refaxed 

## 2021-11-06 NOTE — Telephone Encounter (Signed)
[facility] called to report that they're missing the onset code for the insurance, when the prescription is supposed to start/end. She says they just called her just a few minutes ago.  ?

## 2021-11-09 NOTE — Telephone Encounter (Signed)
Paperwork has been refaxed 

## 2021-11-11 IMAGING — CT CT RENAL STONE PROTOCOL
2 of 4 series · 16 of 46 positions shown, 18 images · non-contrast
Comparison: None.

CLINICAL DATA: Nausea and vomiting, [id number] positive

EXAM:
CT ABDOMEN AND PELVIS WITHOUT CONTRAST
TECHNIQUE: Multidetector CT imaging of the abdomen and pelvis was performed
following the standard protocol without IV contrast.

[Series 2: axial st · axial · 0.80mm/px · z∈[+1027,+1467]mm · 13 of 98 slices shown, 15 images]
[im 5/98  soft-tissue]
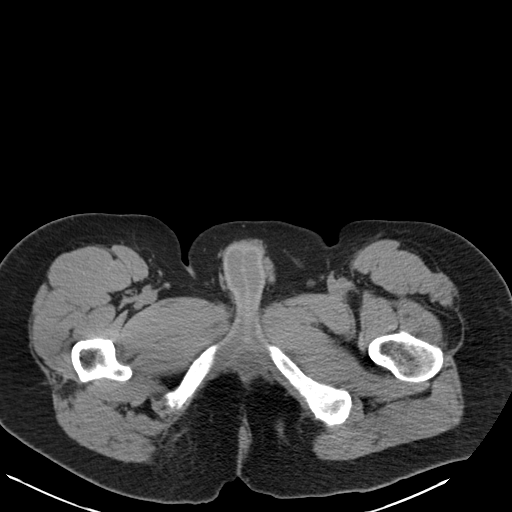
[im 5/98  bone]
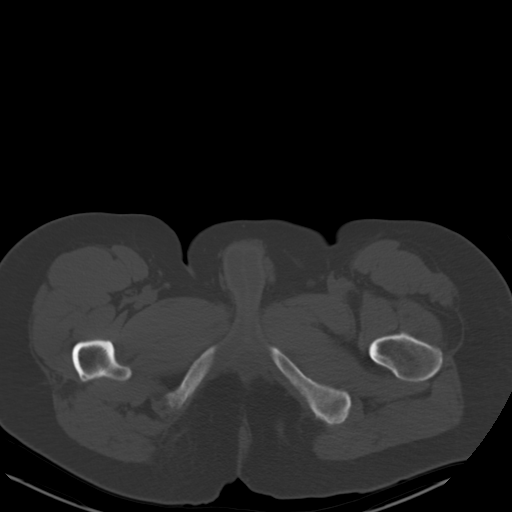
[im 15/98  soft-tissue]
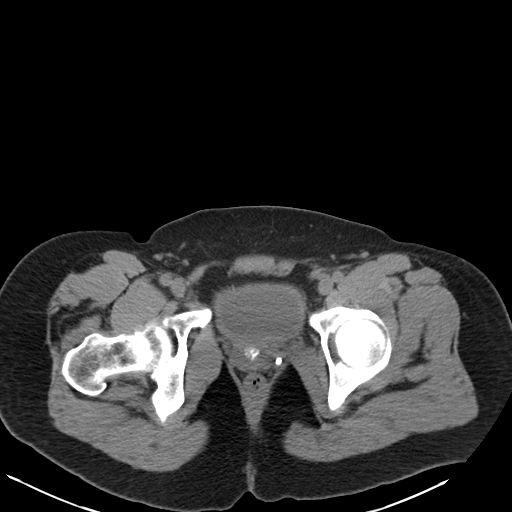
[im 20/98  soft-tissue]
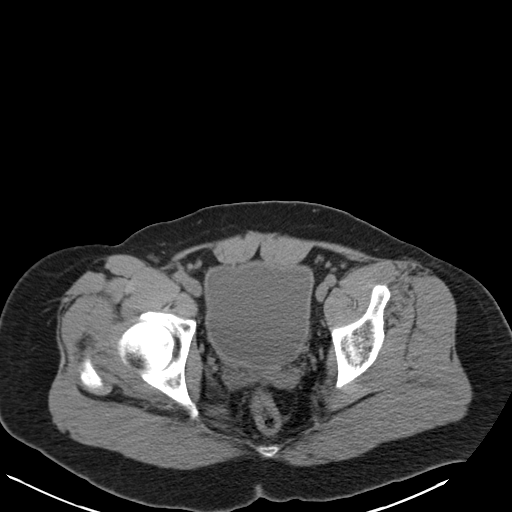
[im 30/98  soft-tissue]
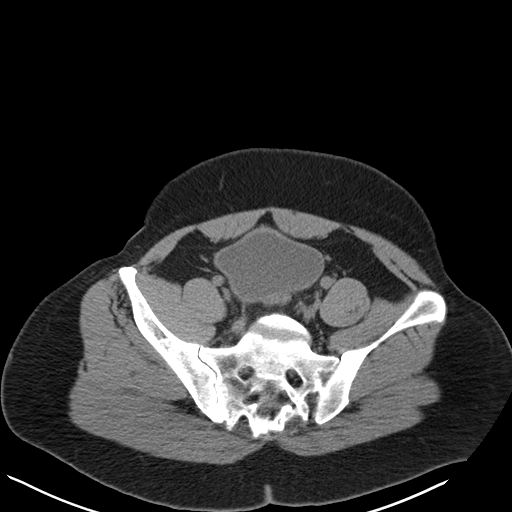
[im 34/98  soft-tissue]
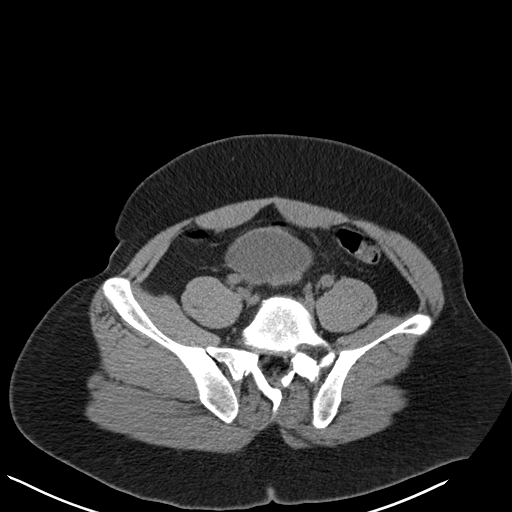
[im 44/98  soft-tissue]
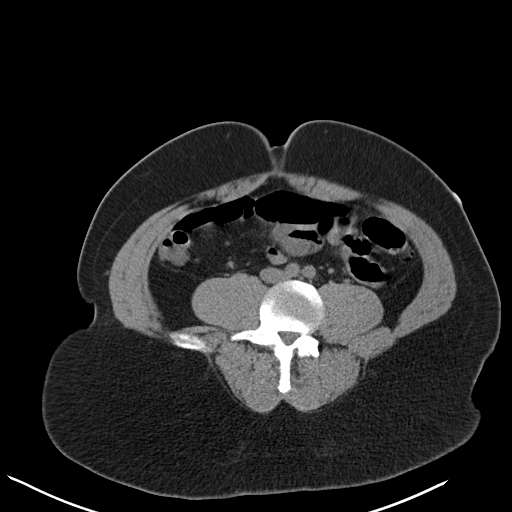
[im 49/98  soft-tissue]
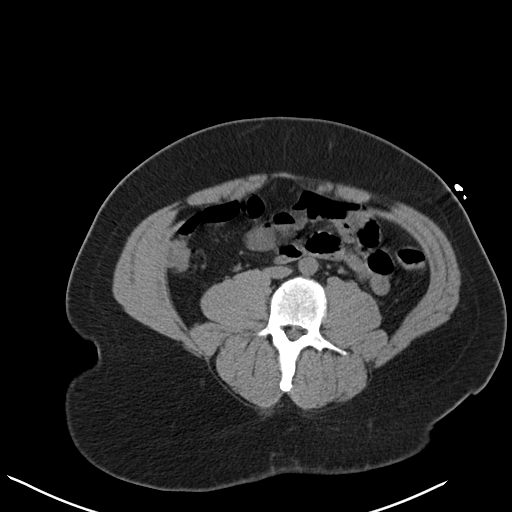
[im 54/98  soft-tissue]
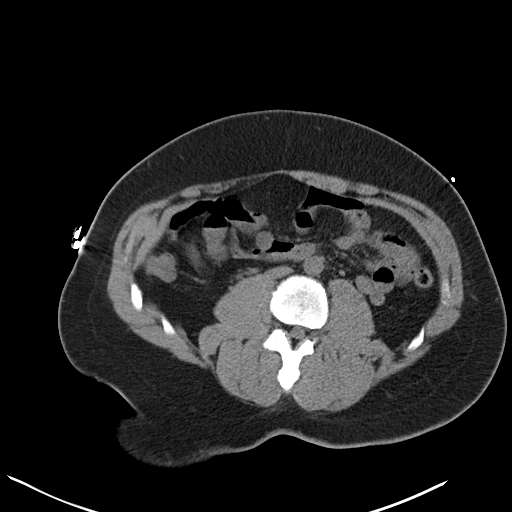
[im 64/98  soft-tissue]
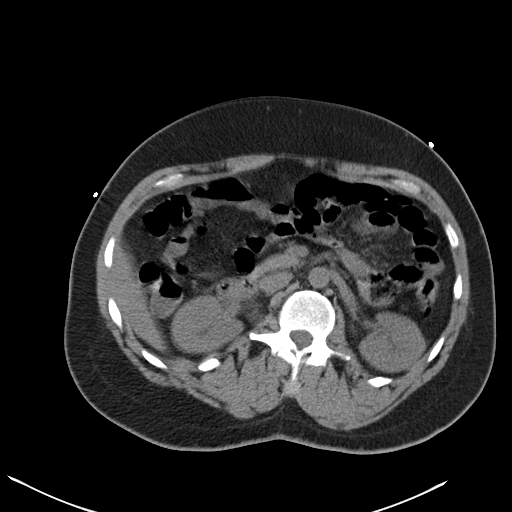
[im 64/98  bone]
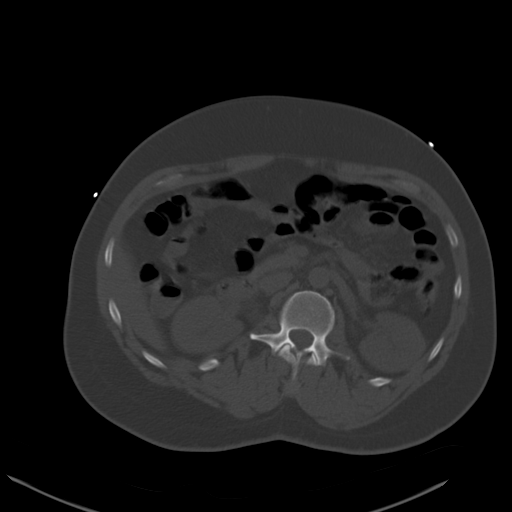
[im 68/98  soft-tissue]
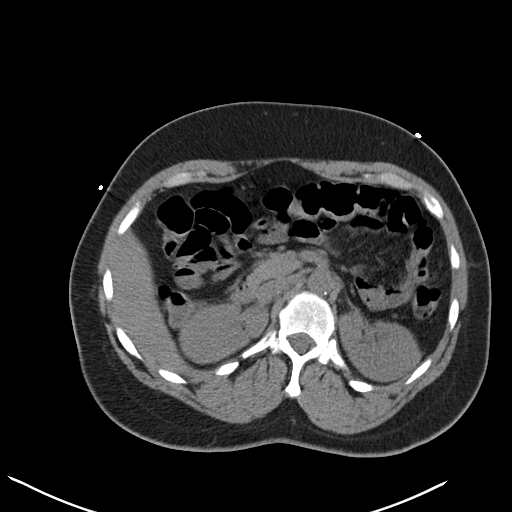
[im 78/98  soft-tissue]
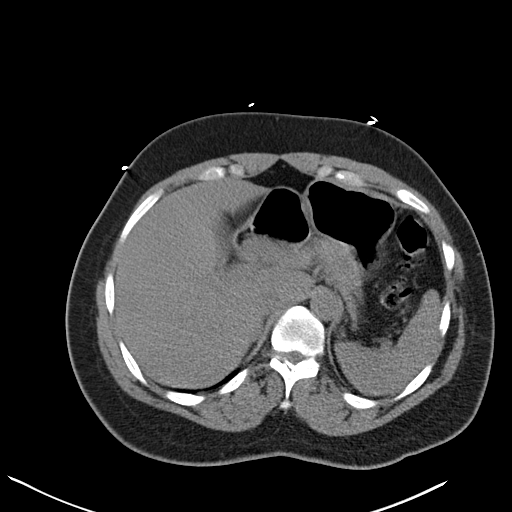
[im 83/98  soft-tissue]
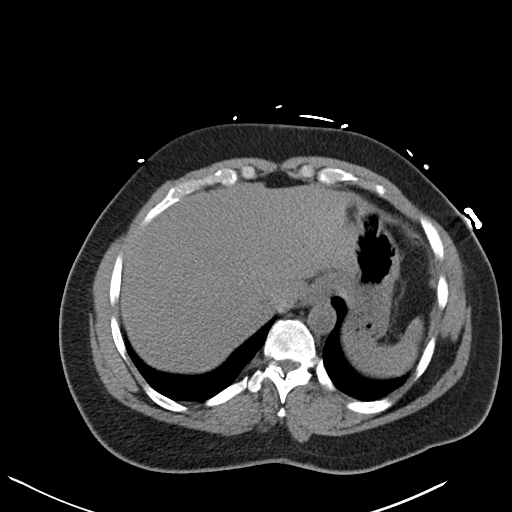
[im 93/98  soft-tissue]
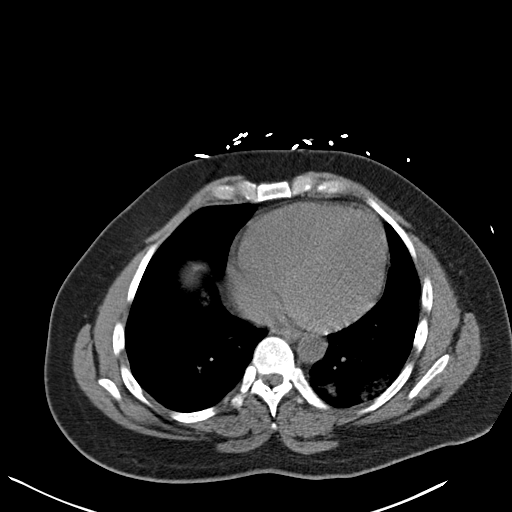

[Series 4: coronal · coronal · 0.71mm/px · 3 of 151 slices shown]
[im 51/151  soft-tissue]
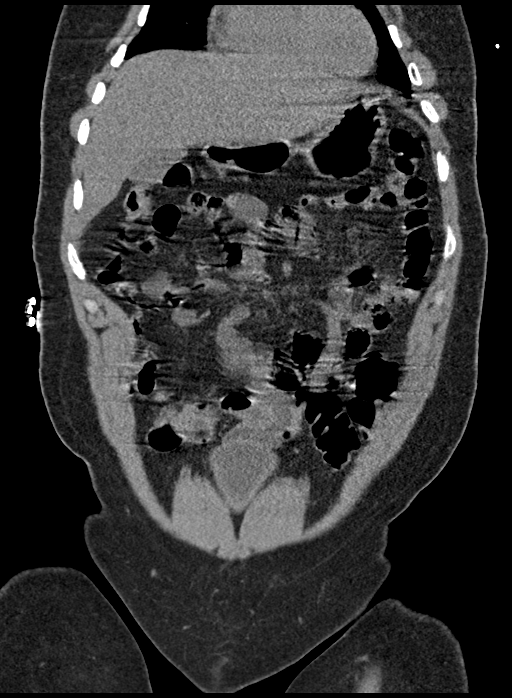
[im 67/151  soft-tissue]
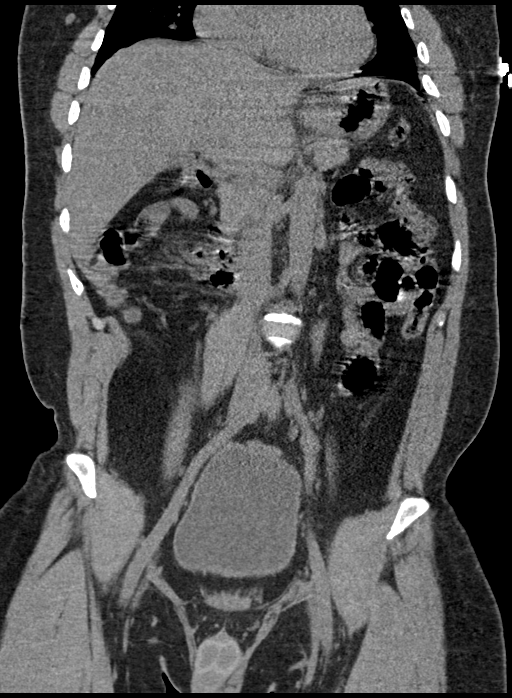
[im 84/151  soft-tissue]
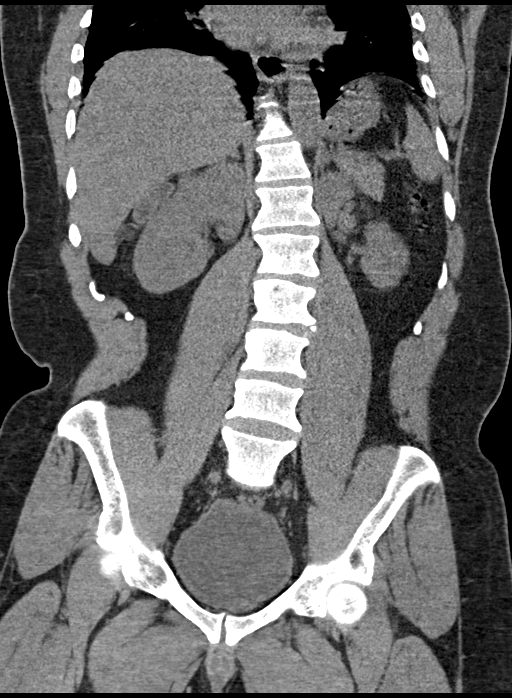

[16 of 46 positions shown; findings below may reference images not displayed]

FINDINGS: Lower chest: Multifocal bibasilar ground-glass airspace disease
consistent with COVID 19 pneumonia.

Hepatobiliary: No focal liver abnormality is seen. No gallstones,
gallbladder wall thickening, or biliary dilatation.

Pancreas: Unremarkable. No pancreatic ductal dilatation or
surrounding inflammatory changes.

Spleen: Normal in size without focal abnormality.

Adrenals/Urinary Tract: Punctate less than 2 mm nonobstructing
calculus left kidney image [DATE]. No other urinary tract calculi or
obstructive uropathy. The bladder is unremarkable. Adrenals are
normal.

Stomach/Bowel: No bowel obstruction or ileus. No bowel wall
thickening or inflammatory change. The appendix, if still present,
is not well visualized.

Vascular/Lymphatic: No significant vascular findings are present. No
enlarged abdominal or pelvic lymph nodes.

Reproductive: Prostate is unremarkable.

Other: No free fluid or free gas.  No abdominal wall hernia.

Musculoskeletal: No acute or destructive bony lesions. Reconstructed
images demonstrate no additional findings.
IMPRESSION: 1. Multifocal bibasilar pneumonia consistent with COVID 19.
2. Punctate less than 2 mm nonobstructing left renal calculus.

## 2022-02-09 NOTE — Progress Notes (Deleted)
Office Visit Note  Patient: Alan Owens             Date of Birth: 1981/05/24           MRN: [id number]             PCP: Georganna Skeans, MD Referring: Georganna Skeans, MD Visit Date: 02/22/2022   Subjective:  No chief complaint on file.   History of Present Illness: Alan Owens is a 41 y.o. male here for follow up for gout.   Previous HPI 08/24/2021 Alan Owens is a 41 y.o. male here for follow up for gout on allopurinol 200 mg daily. His foot pain and swelling has been improved with no major flare ups since our last visit. However he has some bilateral hand swelling and stiffness that is new. This is apparently most noticeable first thing in the morning he is stiff and by the end of the day increased swelling is present. No discoloration or rashes. 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   Previous HPI 02/10/21 Alan Owens is a 41 y.o. male here for follow up for gout after initial visit last month started allopurinol 100 mg PO daily. He was not able to afford colchicine as initially prescribed and treated with a short prednisone prescription for the joint pain and swelling. Currently swelling and pain seems to be improved, taking prednisone in the evening due to foot pain interfering with sleep in particular. Mr. Kihara is aphasic so history provided by his mother.   Previous HPI: 01/13/21 Alan Owens is a 41 y.o. male here for bilateral hand and foot pain with hyperurecemia. He is nonverbal chronically so history is obtained from his mother and chart review. He experienced increased hand and feet pain with swelling ongoing since around 6 weeks ago. This was initially noticed affecting the left foot especially around the great toe joint with redness and swelling and pain with walking or even laying a sheet or clothing atop the foot. He experienced a good improvement with oral prednisone but symptoms returned after stopping  this. He also has pain in his hands with some amount of intermittent swelling reported. She reports it is difficult to tell joint swelling due to some chronic peripheral edema related to heart failure.     No Rheumatology ROS completed.   PMFS History:  Patient Active Problem List   Diagnosis Date Noted   Bilateral hand swelling 08/24/2021   Gout 01/13/2021   Prediabetes 11/21/2020   Hypertension    Pneumonia due to COVID-19 virus 07/16/2020   ARF (acute renal failure) (HCC) 07/15/2020   Glaucoma 09/10/2018   Congestive heart failure (HCC) 09/10/2018   Dysphagia 09/10/2018   Mixed hyperlipidemia 09/10/2018   Cognitive impairment 09/10/2018   Skin picking habit 09/10/2018    Past Medical History:  Diagnosis Date   Acid reflux    Blind left eye    Congestive heart failure (HCC)    Hypercholesterolemia    Hypertension    Juvenile glaucoma    Mental developmental delay    Renal failure    Vitamin D deficiency     Family History  Problem Relation Age of Onset   Emphysema Mother    Anemia Sister    Hypertension Brother    Hypertension Brother    Diabetes Brother    Multiple sclerosis Maternal Grandmother    Hypertension Maternal Grandmother    Emphysema Maternal Grandfather  Lupus Maternal Uncle    Gout Maternal Uncle    Colon cancer Neg Hx    Esophageal cancer Neg Hx    Stomach cancer Neg Hx    Rectal cancer Neg Hx    Past Surgical History:  Procedure Laterality Date   MULTIPLE TOOTH EXTRACTIONS Bilateral 2009   all teeth extracted per patient's mother   Social History   Social History Narrative   Not on file   Immunization History  Administered Date(s) Administered   Tdap 07/13/2015     Objective: Vital Signs: There were no vitals taken for this visit.   Physical Exam   Musculoskeletal Exam: ***  CDAI Exam: CDAI Score: -- Patient Global: --; Provider Global: -- Swollen: --; Tender: -- Joint Exam 02/22/2022   No joint exam has been  documented for this visit   There is currently no information documented on the homunculus. Go to the Rheumatology activity and complete the homunculus joint exam.  Investigation: No additional findings.  Imaging: No results found.  Recent Labs: Lab Results  Component Value Date   WBC 6.8 07/21/2020   HGB 11.4 (L) 07/21/2020   PLT 258 07/21/2020   NA 139 02/10/2021   K 4.0 02/10/2021   CL 103 02/10/2021   CO2 26 02/10/2021   GLUCOSE 88 02/10/2021   BUN 14 02/10/2021   CREATININE 1.19 02/10/2021   BILITOT 0.6 02/10/2021   ALKPHOS 131 (H) 08/14/2020   AST 9 (L) 02/10/2021   ALT 10 02/10/2021   PROT 7.6 02/10/2021   ALBUMIN 4.3 08/14/2020   CALCIUM 10.5 (H) 02/10/2021   GFRAA 63 08/14/2020    Speciality Comments: No specialty comments available.  Procedures:  No procedures performed Allergies: Patient has no known allergies.   Assessment / Plan:     Visit Diagnoses: No diagnosis found.  ***  Orders: No orders of the defined types were placed in this encounter.  No orders of the defined types were placed in this encounter.    Follow-Up Instructions: No follow-ups on file.   Jairo Ben, RT  Note - This record has been created using Animal nutritionist.  Chart creation errors have been sought, but may not always  have been located. Such creation errors do not reflect on  the standard of medical care.

## 2022-02-22 ENCOUNTER — Ambulatory Visit: Payer: Medicaid Other | Admitting: Internal Medicine

## 2022-02-22 DIAGNOSIS — M7989 Other specified soft tissue disorders: Secondary | ICD-10-CM

## 2022-02-22 DIAGNOSIS — M1A9XX0 Chronic gout, unspecified, without tophus (tophi): Secondary | ICD-10-CM

## 2022-03-17 NOTE — Progress Notes (Deleted)
Office Visit Note  Patient: Alan Owens             Date of Birth: Nov 23, 1980           MRN: [id number]             PCP: Georganna Skeans, MD Referring: Georganna Skeans, MD Visit Date: 03/22/2022   Subjective:  No chief complaint on file.   History of Present Illness: Alan Owens is a 41 y.o. male here for follow up for gout   Previous HPI 08/24/2021 Alan Owens is a 41 y.o. male here for follow up for gout on allopurinol 200 mg daily. His foot pain and swelling has been improved with no major flare ups since our last visit. However he has some bilateral hand swelling and stiffness that is new. This is apparently most noticeable first thing in the morning he is stiff and by the end of the day increased swelling is present. No discoloration or rashes. 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   Previous HPI 02/10/21 Alan Owens is a 41 y.o. male here for follow up for gout after initial visit last month started allopurinol 100 mg PO daily. He was not able to afford colchicine as initially prescribed and treated with a short prednisone prescription for the joint pain and swelling. Currently swelling and pain seems to be improved, taking prednisone in the evening due to foot pain interfering with sleep in particular. 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   Previous HPI: 01/13/21 Alan Owens is a 41 y.o. male here for bilateral hand and foot pain with hyperurecemia. He is nonverbal chronically so history is obtained from his mother and chart review. He experienced increased hand and feet pain with swelling ongoing since around 6 weeks ago. This was initially noticed affecting the left foot especially around the great toe joint with redness and swelling and pain with walking or even laying a sheet or clothing atop the foot. He experienced a good improvement with oral prednisone but symptoms returned after stopping  this. He also has pain in his hands with some amount of intermittent swelling reported. She reports it is difficult to tell joint swelling due to some chronic peripheral edema related to heart failure.      No Rheumatology ROS completed.   PMFS History:  Patient Active Problem List   Diagnosis Date Noted   Bilateral hand swelling 08/24/2021   Gout 01/13/2021   Prediabetes 11/21/2020   Hypertension    Pneumonia due to COVID-19 virus 07/16/2020   ARF (acute renal failure) (HCC) 07/15/2020   Glaucoma 09/10/2018   Congestive heart failure (HCC) 09/10/2018   Dysphagia 09/10/2018   Mixed hyperlipidemia 09/10/2018   Cognitive impairment 09/10/2018   Skin picking habit 09/10/2018    Past Medical History:  Diagnosis Date   Acid reflux    Blind left eye    Congestive heart failure (HCC)    Hypercholesterolemia    Hypertension    Juvenile glaucoma    Mental developmental delay    Renal failure    Vitamin D deficiency     Family History  Problem Relation Age of Onset   Emphysema Mother    Anemia Sister    Hypertension Brother    Hypertension Brother    Diabetes Brother    Multiple sclerosis Maternal Grandmother    Hypertension Maternal Grandmother    Emphysema Maternal Grandfather  Lupus Maternal Uncle    Gout Maternal Uncle    Colon cancer Neg Hx    Esophageal cancer Neg Hx    Stomach cancer Neg Hx    Rectal cancer Neg Hx    Past Surgical History:  Procedure Laterality Date   MULTIPLE TOOTH EXTRACTIONS Bilateral 2009   all teeth extracted per patient's mother   Social History   Social History Narrative   Not on file   Immunization History  Administered Date(s) Administered   Tdap 07/13/2015     Objective: Vital Signs: There were no vitals taken for this visit.   Physical Exam   Musculoskeletal Exam: ***  CDAI Exam: CDAI Score: -- Patient Global: --; Provider Global: -- Swollen: --; Tender: -- Joint Exam 03/22/2022   No joint exam has been  documented for this visit   There is currently no information documented on the homunculus. Go to the Rheumatology activity and complete the homunculus joint exam.  Investigation: No additional findings.  Imaging: No results found.  Recent Labs: Lab Results  Component Value Date   WBC 6.8 07/21/2020   HGB 11.4 (L) 07/21/2020   PLT 258 07/21/2020   NA 139 02/10/2021   K 4.0 02/10/2021   CL 103 02/10/2021   CO2 26 02/10/2021   GLUCOSE 88 02/10/2021   BUN 14 02/10/2021   CREATININE 1.19 02/10/2021   BILITOT 0.6 02/10/2021   ALKPHOS 131 (H) 08/14/2020   AST 9 (L) 02/10/2021   ALT 10 02/10/2021   PROT 7.6 02/10/2021   ALBUMIN 4.3 08/14/2020   CALCIUM 10.5 (H) 02/10/2021   GFRAA 63 08/14/2020    Speciality Comments: No specialty comments available.  Procedures:  No procedures performed Allergies: Patient has no known allergies.   Assessment / Plan:     Visit Diagnoses: No diagnosis found.  ***  Orders: No orders of the defined types were placed in this encounter.  No orders of the defined types were placed in this encounter.    Follow-Up Instructions: No follow-ups on file.   Jairo Ben, RT  Note - This record has been created using Animal nutritionist.  Chart creation errors have been sought, but may not always  have been located. Such creation errors do not reflect on  the standard of medical care.

## 2022-03-18 IMAGING — DX DG FOOT COMPLETE 3+V*L*
3 series · 3 of 3 positions shown · non-contrast
Comparison: None.

CLINICAL DATA: Left foot pain.

EXAM:
LEFT FOOT - COMPLETE 3+ VIEW

[foot supine dp]
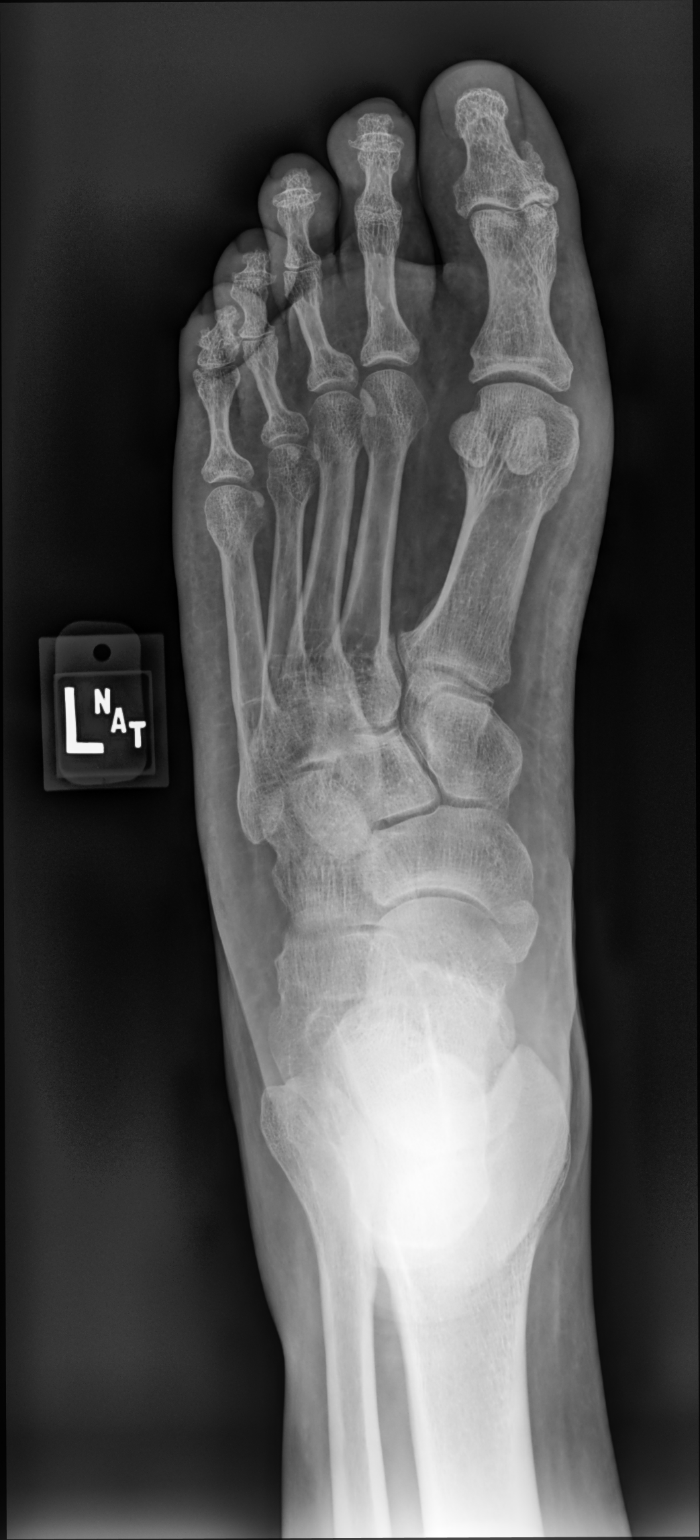

[foot medial oblique]
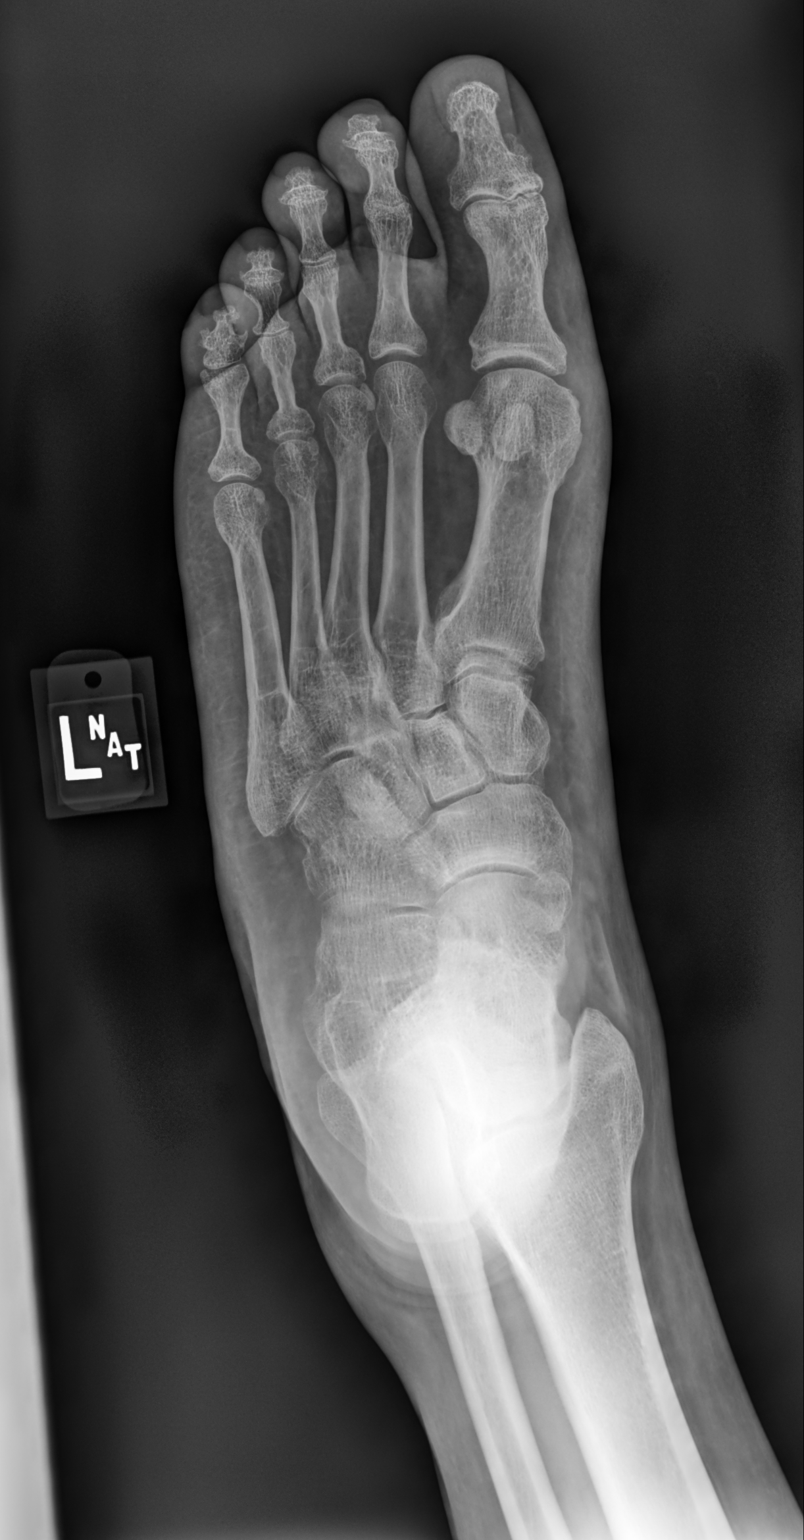

[foot supine lat]
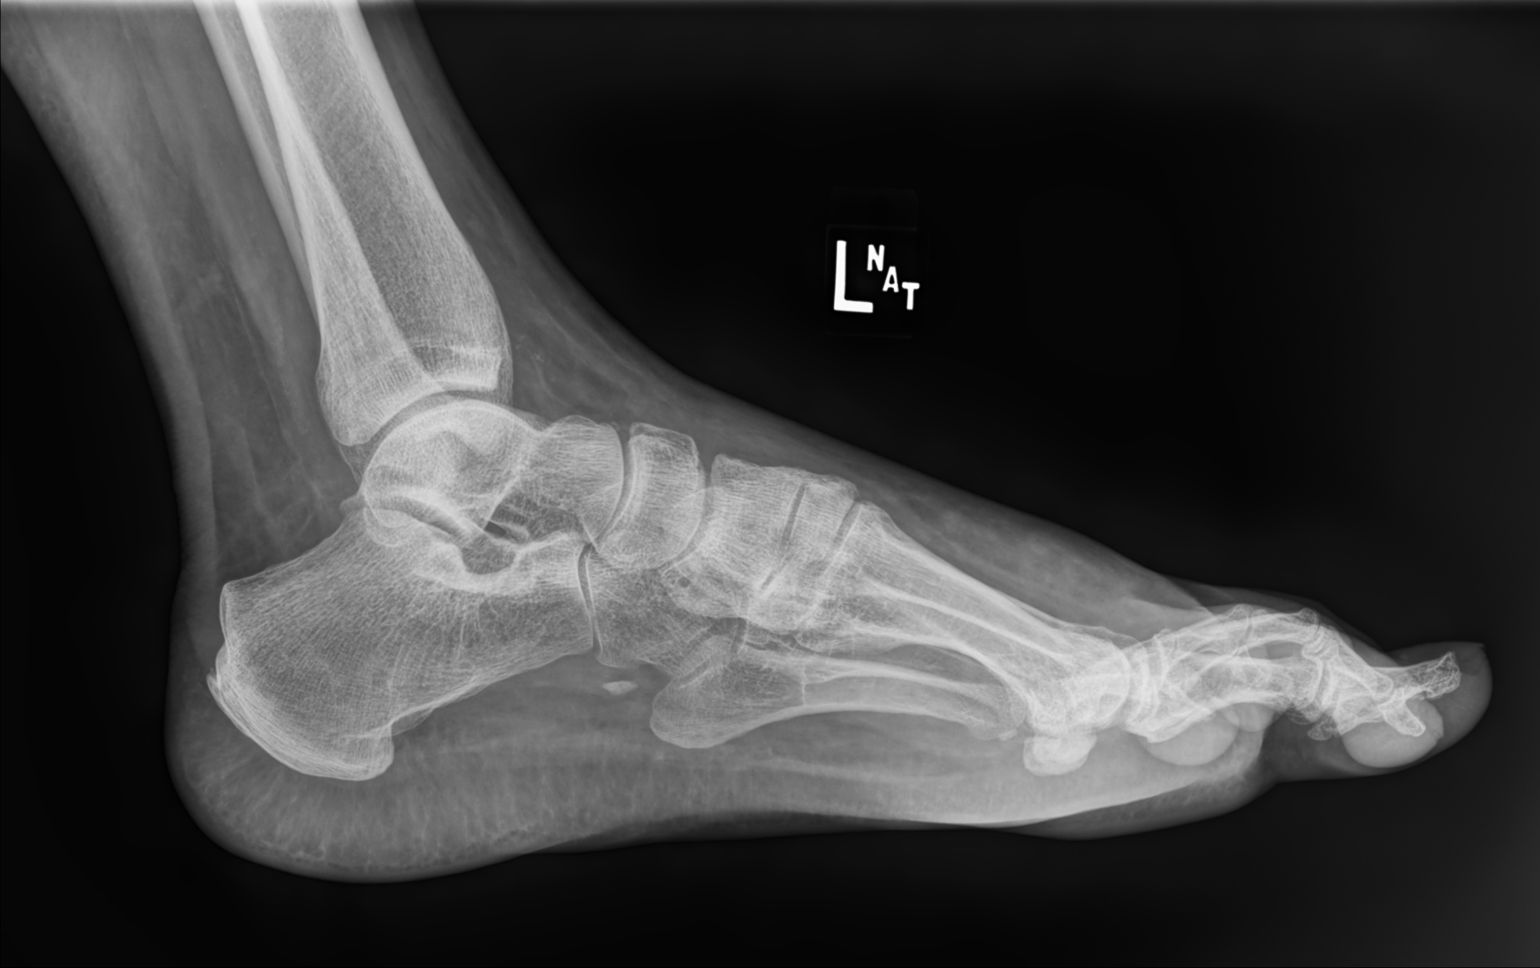

[3 of 3 positions shown; findings below may reference images not displayed]

FINDINGS: The bones are subjectively under mineralized. Slight hammertoe
deformity of the digits. Slight hallux valgus with minimal
degenerative change of the first metatarsal phalangeal joint.
Minimal degenerative spurring of the great toe interphalangeal
joint. No erosion or bony destruction. Incidental os navicular and
peroneal. Small Achilles tendon enthesophyte. Soft tissue edema
overlies the dorsum of the foot. Minimal medial soft tissue
thickening about the medial first metatarsal.
IMPRESSION: 1. Slight hallux valgus. Minimal degenerative change of the first
metatarsophalangeal joint and great toe interphalangeal joint.
2. Small Achilles tendon enthesophyte.
3. Subjective osteopenia.

## 2022-03-22 ENCOUNTER — Ambulatory Visit: Payer: Medicaid Other | Admitting: Internal Medicine

## 2022-03-22 DIAGNOSIS — M7989 Other specified soft tissue disorders: Secondary | ICD-10-CM

## 2022-03-22 DIAGNOSIS — M1A9XX0 Chronic gout, unspecified, without tophus (tophi): Secondary | ICD-10-CM

## 2022-03-31 ENCOUNTER — Other Ambulatory Visit: Payer: Self-pay | Admitting: Family Medicine

## 2022-04-01 NOTE — Telephone Encounter (Signed)
Requested Prescriptions  Pending Prescriptions Disp Refills  . hydrOXYzine (ATARAX) 50 MG tablet [Pharmacy Med Name: HYDROXYZINE HCL 50 MG TABLET] 30 tablet 1    Sig: TAKE 1 TABLET BY MOUTH EVERY DAY AT NIGHT     Ear, Nose, and Throat:  Antihistamines 2 Failed - 03/31/2022 12:06 PM      Failed - Cr in normal range and within 360 days    Creat  Date Value Ref Range Status  02/10/2021 1.19 0.60 - 1.29 mg/dL Final   Creatinine, Urine  Date Value Ref Range Status  07/16/2020 321.85 mg/dL Final    Comment:    Performed at [facility], [place] [address], [place], [place] [address]         Passed - Valid encounter within last 12 months    Recent Outpatient Visits          8 months ago Hypertension, unspecified type   Primary Care at [facility], MD   1 year ago Left foot pain   Primary Care at [facility] [place], NP   1 year ago Encounter for annual physical exam   Primary Care at [facility], [place] Be[place], MD   1 year ago Unexplained weight gain   Primary Care at [facility], Bayard Beaver, MD   2 years ago Annual physical exam   Primary Care at [facility], Bayard Beaver, MD

## 2022-04-20 ENCOUNTER — Other Ambulatory Visit: Payer: Self-pay | Admitting: Internal Medicine

## 2022-04-20 DIAGNOSIS — M1A9XX0 Chronic gout, unspecified, without tophus (tophi): Secondary | ICD-10-CM

## 2022-05-12 ENCOUNTER — Other Ambulatory Visit: Payer: Self-pay | Admitting: Cardiovascular Disease

## 2022-05-12 ENCOUNTER — Other Ambulatory Visit: Payer: Self-pay | Admitting: Family Medicine

## 2022-05-13 NOTE — Telephone Encounter (Signed)
Rx 04/01/22 #30 1RF- too soon Requested Prescriptions  Pending Prescriptions Disp Refills   hydrOXYzine (ATARAX) 50 MG tablet [Pharmacy Med Name: HYDROXYZINE HCL 50 MG TABLET] 30 tablet 1    Sig: TAKE 1 TABLET BY MOUTH EVERY DAY AT NIGHT     Ear, Nose, and Throat:  Antihistamines 2 Failed - 05/12/2022  9:13 PM      Failed - Cr in normal range and within 360 days    Creat  Date Value Ref Range Status  02/10/2021 1.19 0.60 - 1.29 mg/dL Final   Creatinine, Urine  Date Value Ref Range Status  07/16/2020 321.85 mg/dL Final    Comment:    Performed at [facility], [place] [address] [place], [place] [address]         Passed - Valid encounter within last 12 months    Recent Outpatient Visits           9 months ago Hypertension, unspecified type   Primary Care at [facility], MD   1 year ago Left foot pain   Primary Care at [facility], [place], NP   1 year ago Encounter for annual physical exam   Primary Care at [facility], Bayard Beaver, MD   1 year ago Unexplained weight gain   Primary Care at [facility], Bayard Beaver, MD   2 years ago Annual physical exam   Primary Care at [facility], Bayard Beaver, MD

## 2022-05-25 ENCOUNTER — Other Ambulatory Visit: Payer: Self-pay | Admitting: Internal Medicine

## 2022-05-25 DIAGNOSIS — M1A9XX0 Chronic gout, unspecified, without tophus (tophi): Secondary | ICD-10-CM

## 2022-06-06 ENCOUNTER — Other Ambulatory Visit: Payer: Self-pay | Admitting: Cardiovascular Disease

## 2022-06-06 ENCOUNTER — Other Ambulatory Visit: Payer: Self-pay | Admitting: Family Medicine

## 2022-06-14 ENCOUNTER — Other Ambulatory Visit: Payer: Self-pay | Admitting: Cardiovascular Disease

## 2022-06-14 DIAGNOSIS — I509 Heart failure, unspecified: Secondary | ICD-10-CM

## 2022-07-10 ENCOUNTER — Other Ambulatory Visit: Payer: Self-pay | Admitting: Family Medicine

## 2022-07-11 NOTE — Telephone Encounter (Signed)
Requested medication (s) are due for refill today: yes  Requested medication (s) are on the active medication list: yes  Last refill:  06/08/22  Future visit scheduled: no  Notes to clinic:  over due lab work   Requested Prescriptions  Pending Prescriptions Disp Refills   hydrOXYzine (ATARAX) 50 MG tablet [Pharmacy Med Name: HYDROXYZINE HCL 50 MG TABLET] 30 tablet 0    Sig: TAKE 1 TABLET BY MOUTH EVERY DAY AT NIGHT     Ear, Nose, and Throat:  Antihistamines 2 Failed - 07/10/2022 12:22 PM      Failed - Cr in normal range and within 360 days    Creat  Date Value Ref Range Status  02/10/2021 1.19 0.60 - 1.29 mg/dL Final   Creatinine, Urine  Date Value Ref Range Status  07/16/2020 321.85 mg/dL Final    Comment:    Performed at [facility], [address], [place], [place] [address]         Passed - Valid encounter within last 12 months    Recent Outpatient Visits           11 months ago Hypertension, unspecified type   Primary Care at [facility], MD   1 year ago Left foot pain   Primary Care at [facility], [place], NP   1 year ago Encounter for annual physical exam   Primary Care at [facility], Kandee Keen, MD   2 years ago Unexplained weight gain   Primary Care at [facility], Kandee Keen, MD   2 years ago Annual physical exam   Primary Care at [facility], Kandee Keen, MD       Future Appointments             In 1 month Eden Emms, Noralyn Pick, MD [facility] [place]. [facility]

## 2022-07-19 ENCOUNTER — Other Ambulatory Visit: Payer: Self-pay | Admitting: Internal Medicine

## 2022-07-19 DIAGNOSIS — M1A9XX0 Chronic gout, unspecified, without tophus (tophi): Secondary | ICD-10-CM

## 2022-07-20 ENCOUNTER — Encounter (HOSPITAL_COMMUNITY): Payer: Self-pay

## 2022-07-20 ENCOUNTER — Ambulatory Visit (HOSPITAL_COMMUNITY)
Admission: RE | Admit: 2022-07-20 | Discharge: 2022-07-20 | Disposition: A | Discharge status: Home / Self Care | Payer: Medicaid Other | Source: Ambulatory Visit | Attending: Cardiovascular Disease | Admitting: Cardiovascular Disease

## 2022-07-20 DIAGNOSIS — I77810 Thoracic aortic ectasia: Secondary | ICD-10-CM | POA: Diagnosis present

## 2022-07-20 LAB — POCT I-STAT CREATININE: Creatinine, Ser: 1.1 mg/dL (ref 0.61–1.24)

## 2022-07-20 MED ORDER — IOHEXOL 350 MG/ML SOLN
100.0000 mL | Freq: Once | INTRAVENOUS | Status: AC | PRN
  Administered 2022-07-20: 100 mL via INTRAVENOUS

## 2022-07-20 MED ORDER — SODIUM CHLORIDE (PF) 0.9 % IJ SOLN
INTRAMUSCULAR | Status: AC
  Filled 2022-07-20: qty 50

## 2022-07-26 ENCOUNTER — Telehealth: Payer: Self-pay | Admitting: Cardiovascular Disease

## 2022-07-26 ENCOUNTER — Ambulatory Visit: Payer: Self-pay | Admitting: *Deleted

## 2022-07-26 DIAGNOSIS — I77810 Thoracic aortic ectasia: Secondary | ICD-10-CM

## 2022-07-26 NOTE — Telephone Encounter (Signed)
  Chief Complaint: ear pain Symptoms: pain, discharge from R ear Frequency: started today Pertinent Negatives: Patient denies fever, sinus symptoms Disposition: [] ED /[] Urgent Care (no appt availability in office) / [] Appointment(In office/virtual)/ []  [place] [facility]/ [] Home Care/ [x] Refused Recommended Disposition /[] [place] [facility]/ []  Follow-up with PCP Additional Notes: Patient's mother is requesting appointment in office- need 3-4 days advance notice- requesting Th/Fr appointment due to transportation needs.

## 2022-07-26 NOTE — Telephone Encounter (Signed)
Summary: ear pain   Patient's mother states patient woke up with right ear pain this morning. Mother states that when she wiped his ear he had wax and a little blood on the tissue. No appointments available this week with office.     Reason for Disposition  [1] SEVERE pain AND [2] not improved 2 hours after taking analgesic medication (e.g., ibuprofen or acetaminophen)  Answer Assessment - Initial Assessment Questions 1. LOCATION: "Which ear is involved?"     Right ear 2. ONSET: "When did the ear start hurting"      This morning 3. SEVERITY: "How bad is the pain?"  (Scale 1-10; mild, moderate or severe)   - MILD (1-3): doesn't interfere with normal activities    - MODERATE (4-7): interferes with normal activities or awakens from sleep    - SEVERE (8-10): excruciating pain, unable to do any normal activities      severe 4. URI SYMPTOMS: "Do you have a runny nose or cough?"     no 5. FEVER: "Do you have a fever?" If Yes, ask: "What is your temperature, how was it measured, and when did it start?"     no 6. CAUSE: "Have you been swimming recently?", "How often do you use Q-TIPS?", "Have you had any recent air travel or scuba diving?"     no 7. OTHER SYMPTOMS: "Do you have any other symptoms?" (e.g., headache, stiff neck, dizziness, vomiting, runny nose, decreased hearing)     Neck pain- under the ear  Protocols used: Bethann Punches

## 2022-07-26 NOTE — Telephone Encounter (Signed)
  Josue Hector, MD 07/20/2022  4:58 PM EST     Aortic aneurysm 5.3 refer to [facility] for opinion I doubt they would deem him an operative candidate   Called back with results. Will place referral.

## 2022-07-26 NOTE — Telephone Encounter (Signed)
Patient's mother is calling to get clarification on the patient's results. Please call back to discuss

## 2022-07-27 ENCOUNTER — Telehealth: Payer: Self-pay | Admitting: Cardiovascular Disease

## 2022-07-27 DIAGNOSIS — I77810 Thoracic aortic ectasia: Secondary | ICD-10-CM

## 2022-07-27 NOTE — Telephone Encounter (Signed)
Chanda from [facility] would like to speak to RN about referral placed to them

## 2022-07-27 NOTE — Telephone Encounter (Signed)
Called Chanda with [facility]. She stated they do not do anything with the aorta, and that patient probably needs referral to [facility]. Will place referral to [facility] and make sure that patient does not need to see [facility] for anything else.

## 2022-07-28 NOTE — Addendum Note (Signed)
Addended by: Aris Georgia, Laurell Coalson L on: 07/28/2022 12:17 PM   Modules accepted: Orders

## 2022-07-30 ENCOUNTER — Other Ambulatory Visit: Payer: Self-pay | Admitting: Family Medicine

## 2022-08-04 NOTE — Telephone Encounter (Signed)
The mother called in stating the patient does not sleep well and really this this medication as soon as possible. He has been scheduled for a medication management appt in February to see his provider. Please assist patient further

## 2022-08-07 ENCOUNTER — Other Ambulatory Visit: Payer: Self-pay

## 2022-08-07 ENCOUNTER — Emergency Department (HOSPITAL_COMMUNITY)
Admission: EM | Admit: 2022-08-07 | Discharge: 2022-08-07 | Disposition: A | Discharge status: Home / Self Care | Payer: Medicaid Other | Attending: Emergency Medicine | Admitting: Emergency Medicine

## 2022-08-07 DIAGNOSIS — I110 Hypertensive heart disease with heart failure: Secondary | ICD-10-CM | POA: Insufficient documentation

## 2022-08-07 DIAGNOSIS — I10 Essential (primary) hypertension: Secondary | ICD-10-CM | POA: Insufficient documentation

## 2022-08-07 DIAGNOSIS — R030 Elevated blood-pressure reading, without diagnosis of hypertension: Secondary | ICD-10-CM

## 2022-08-07 DIAGNOSIS — E876 Hypokalemia: Secondary | ICD-10-CM | POA: Insufficient documentation

## 2022-08-07 DIAGNOSIS — Z7982 Long term (current) use of aspirin: Secondary | ICD-10-CM | POA: Diagnosis not present

## 2022-08-07 DIAGNOSIS — R338 Other retention of urine: Secondary | ICD-10-CM

## 2022-08-07 DIAGNOSIS — Z8616 Personal history of COVID-19: Secondary | ICD-10-CM | POA: Insufficient documentation

## 2022-08-07 DIAGNOSIS — I509 Heart failure, unspecified: Secondary | ICD-10-CM | POA: Diagnosis not present

## 2022-08-07 DIAGNOSIS — R339 Retention of urine, unspecified: Secondary | ICD-10-CM | POA: Insufficient documentation

## 2022-08-07 DIAGNOSIS — Z79899 Other long term (current) drug therapy: Secondary | ICD-10-CM | POA: Diagnosis not present

## 2022-08-07 LAB — URINALYSIS, ROUTINE W REFLEX MICROSCOPIC
Bilirubin Urine: NEGATIVE
Glucose, UA: NEGATIVE mg/dL
Hgb urine dipstick: NEGATIVE
Ketones, ur: NEGATIVE mg/dL
Nitrite: NEGATIVE
Protein, ur: 30 mg/dL — AB
Specific Gravity, Urine: 1.005 (ref 1.005–1.030)
pH: 8 (ref 5.0–8.0)

## 2022-08-07 LAB — CBC WITH DIFFERENTIAL/PLATELET
Abs Immature Granulocytes: 0.01 10*3/uL (ref 0.00–0.07)
Basophils Absolute: 0 10*3/uL (ref 0.0–0.1)
Basophils Relative: 1 %
Eosinophils Absolute: 0.2 10*3/uL (ref 0.0–0.5)
Eosinophils Relative: 3 %
HCT: 36.9 % — ABNORMAL LOW (ref 39.0–52.0)
Hemoglobin: 12.7 g/dL — ABNORMAL LOW (ref 13.0–17.0)
Immature Granulocytes: 0 %
Lymphocytes Relative: 40 %
Lymphs Abs: 3 10*3/uL (ref 0.7–4.0)
MCH: 30.9 pg (ref 26.0–34.0)
MCHC: 34.4 g/dL (ref 30.0–36.0)
MCV: 89.8 fL (ref 80.0–100.0)
Monocytes Absolute: 0.5 10*3/uL (ref 0.1–1.0)
Monocytes Relative: 6 %
Neutro Abs: 3.8 10*3/uL (ref 1.7–7.7)
Neutrophils Relative %: 50 %
Platelets: 250 10*3/uL (ref 150–400)
RBC: 4.11 MIL/uL — ABNORMAL LOW (ref 4.22–5.81)
RDW: 13.3 % (ref 11.5–15.5)
WBC: 7.5 10*3/uL (ref 4.0–10.5)
nRBC: 0 % (ref 0.0–0.2)

## 2022-08-07 LAB — BASIC METABOLIC PANEL
Anion gap: 8 (ref 5–15)
BUN: 7 mg/dL (ref 6–20)
CO2: 28 mmol/L (ref 22–32)
Calcium: 9.6 mg/dL (ref 8.9–10.3)
Chloride: 103 mmol/L (ref 98–111)
Creatinine, Ser: 1.17 mg/dL (ref 0.61–1.24)
GFR, Estimated: >60 mL/min (ref >60)
Glucose, Bld: 104 mg/dL — ABNORMAL HIGH (ref 70–99)
Potassium: 3.1 mmol/L — ABNORMAL LOW (ref 3.5–5.1)
Sodium: 139 mmol/L (ref 135–145)

## 2022-08-07 MED ORDER — HALOPERIDOL LACTATE 5 MG/ML IJ SOLN
5.0000 mg | Freq: Once | INTRAMUSCULAR | Status: AC
  Administered 2022-08-07: 5 mg via INTRAMUSCULAR
  Filled 2022-08-07: qty 1

## 2022-08-07 MED ORDER — AMLODIPINE BESYLATE 5 MG PO TABS
2.5000 mg | ORAL_TABLET | Freq: Once | ORAL | Status: AC
  Administered 2022-08-07: 2.5 mg via ORAL
  Filled 2022-08-07: qty 1

## 2022-08-07 MED ORDER — LORAZEPAM 2 MG/ML IJ SOLN
2.0000 mg | Freq: Once | INTRAMUSCULAR | Status: AC
  Administered 2022-08-07: 2 mg via INTRAMUSCULAR
  Filled 2022-08-07: qty 1

## 2022-08-07 NOTE — ED Triage Notes (Signed)
Patient arrived with EMS from home mother reported that patient has not taken his Amlodipine for several days , BP= 212/140 by EMS .

## 2022-08-07 NOTE — ED Provider Notes (Signed)
  Physical Exam  BP (!) 180/126   Pulse 87   Temp 98.1 F (36.7 C)   Resp 20   SpO2 99%   Physical Exam  Procedures  Procedures  ED Course / MDM    Medical Decision Making Amount and/or Complexity of Data Reviewed Labs: ordered.  Risk Prescription drug management.   Assuming care of patient from Dr. Leonette Monarch   Patient in the ED for elevated BP. He has intellectual delay. Pt was found to have elevated BP and noted to have urinary retention. Haldol ordered for foley catheter placement.  UA pending. Home dose Amlodipine given.  Workup thus far shows no aki. Elevated BP is secondary to urinary retention most likely.      Alan Biles, MD 08/07/22 [phone]

## 2022-08-07 NOTE — ED Provider Notes (Signed)
[facility] Provider Note  CSN: [id number] Arrival date & time: 08/07/22 0031  Chief Complaint(s) Hypertension  HPI Alan Owens is a 42 y.o. male with a past medical history listed below who is nonverbal here with mother who reports that patient has had significant increase in his blood pressure in the last day.  Reports that he has been out of his 2.5 mg of amlodipine for 2 days but has been compliant with his other blood pressure medications.  She denies any known fevers or infections.  No coughing or congestion.  No vomiting.  Patient is nonverbal but able to nod yes or no to questions. I noted that patient's lower abdomen is distended which mom reports this is baseline.  She reports that he is still voiding.  When patient asked whether his has discomfort in his lower abdomen, he nods yes.  The history is provided by a parent.    Past Medical History Past Medical History:  Diagnosis Date   Acid reflux    Blind left eye    Congestive heart failure ([place])    Hypercholesterolemia    Hypertension    Juvenile glaucoma    Mental developmental delay    Renal failure    Vitamin D deficiency    Patient Active Problem List   Diagnosis Date Noted   Bilateral hand swelling 08/24/2021   Gout 01/13/2021   Prediabetes 11/21/2020   Hypertension    Pneumonia due to COVID-19 virus 07/16/2020   ARF (acute renal failure) ([place]) 07/15/2020   Glaucoma 09/10/2018   Congestive heart failure ([place]) 09/10/2018   Dysphagia 09/10/2018   Mixed hyperlipidemia 09/10/2018   Cognitive impairment 09/10/2018   Skin picking habit 09/10/2018   Home Medication(s) Prior to Admission medications   Medication Sig Start Date End Date Taking? Authorizing Provider  allopurinol (ZYLOPRIM) 100 MG tablet Take 2 tablets (200 mg total) by mouth daily. 08/24/21   Rice, Resa Miner, MD  ALPHAGAN P 0.1 % SOLN Apply 1 drop to eye in the morning and at bedtime. 05/24/20    [provider]  ALPHAGAN P 0.15 % ophthalmic solution 1 drop 2 (two) times daily. Patient not taking: Reported on 07/31/2021 07/06/21   [provider]  amLODipine (NORVASC) 2.5 MG tablet Take 1 tablet (2.5 mg total) by mouth daily. 08/21/21   Josue Hector, MD  ascorbic acid (VITAMIN C) 500 MG tablet Take 1 tablet (500 mg total) by mouth daily. 07/22/20   Eugenie Filler, MD  ASPIRIN LOW DOSE 81 MG EC tablet TAKE 1 TABLET BY MOUTH EVERY DAY 12/10/18   Scot Jun, NP  atorvastatin (LIPITOR) 20 MG tablet TAKE 1 TABLET (20 MG TOTAL) BY MOUTH DAILY AT 6 PM. 07/23/21   Dorna Mai, MD  carvedilol (COREG) 6.25 MG tablet TAKE 1 TABLET BY MOUTH 2 TIMES DAILY WITH A MEAL. 06/07/22   Josue Hector, MD  furosemide (LASIX) 20 MG tablet TAKE 1 TABLET BY MOUTH EVERY DAY 06/17/22   Josue Hector, MD  hydrOXYzine (ATARAX) 50 MG tablet TAKE 1 TABLET BY MOUTH EVERY DAY AT NIGHT 06/08/22   Dorna Mai, MD  Ipratropium-Albuterol (COMBIVENT) 20-100 MCG/ACT AERS respimat Inhale 2 puffs into the lungs 2 (two) times daily. Use 2 times daily x 1 week, then as needed. 07/21/20   Eugenie Filler, MD  latanoprost (XALATAN) 0.005 % ophthalmic solution Place 1 drop into both eyes at bedtime. 03/14/19   Gildardo Pounds,  NP  omeprazole (PRILOSEC) 40 MG capsule Take 1 capsule (40 mg total) by mouth daily. 04/29/21   Camillia Herter, NP  Potassium Chloride ER 20 MEQ TBCR TAKE 1 TABLET BY MOUTH EVERY DAY 05/13/22   Josue Hector, MD  zinc sulfate 220 (50 Zn) MG capsule Take 1 capsule (220 mg total) by mouth daily. Patient not taking: Reported on 08/24/2021 07/22/20   Eugenie Filler, MD                                                                                                                                    Allergies Patient has no known allergies.  Review of Systems Review of Systems As noted in HPI  Physical Exam Vital Signs  I have reviewed the triage vital signs BP (!)  180/126   Pulse 87   Temp 98.1 F (36.7 C)   Resp 20   SpO2 99%   Physical Exam Vitals reviewed.  Constitutional:      General: He is not in acute distress.    Appearance: He is well-developed. He is not diaphoretic.  HENT:     Head: Normocephalic and atraumatic.     Right Ear: External ear normal.     Left Ear: External ear normal.     Nose: Nose normal.     Mouth/Throat:     Mouth: Mucous membranes are moist.  Eyes:     General: No scleral icterus.    Conjunctiva/sclera: Conjunctivae normal.  Neck:     Trachea: Phonation normal.  Cardiovascular:     Rate and Rhythm: Normal rate and regular rhythm.  Pulmonary:     Effort: Pulmonary effort is normal. No respiratory distress.     Breath sounds: No stridor.  Abdominal:     General: There is no distension (lower abd).  Musculoskeletal:        General: Normal range of motion.     Cervical back: Normal range of motion.  Neurological:     Mental Status: He is alert and oriented to person, place, and time.  Psychiatric:        Behavior: Behavior normal.     ED Results and Treatments Labs (all labs ordered are listed, but only abnormal results are displayed) Labs Reviewed  CBC WITH DIFFERENTIAL/PLATELET - Abnormal; Notable for the following components:      Result Value   RBC 4.11 (*)    Hemoglobin 12.7 (*)    HCT 36.9 (*)    All other components within normal limits  BASIC METABOLIC PANEL - Abnormal; Notable for the following components:   Potassium 3.1 (*)    Glucose, Bld 104 (*)    All other components within normal limits  URINALYSIS, ROUTINE W REFLEX MICROSCOPIC  EKG  EKG Interpretation  Date/Time:    Ventricular Rate:    PR Interval:    QRS Duration:   QT Interval:    QTC Calculation:   R Axis:     Text Interpretation:         Radiology No results found.  Medications Ordered in  ED Medications  haloperidol lactate (HALDOL) injection 5 mg (has no administration in time range)  amLODipine (NORVASC) tablet 2.5 mg (2.5 mg Oral Given 08/07/22 0045)  LORazepam (ATIVAN) injection 2 mg (2 mg Intramuscular Given 08/07/22 0651)                                                                                                                                     Procedures Procedures  (including critical care time)  Medical Decision Making / ED Course   Medical Decision Making Amount and/or Complexity of Data Reviewed Labs: ordered. Decision-making details documented in ED Course.  Risk Prescription drug management.    Patient with elevated blood pressures. Has only been off of the low-dose amlodipine for couple days.  Mom reports being compliant with other blood pressure medicine. Screening labs obtained to assess for any endorgan damage. Given the distention of the lower abdomen over the bladder, will assess for obstruction.  CBC without leukocytosis or anemia. Metabolic panel with mild hypokalemia without other electrolyte derangements or renal sufficiency Bladder scan with over 600 cc in the bladder. Will need to insert foley UA obtained to assess for infection.  Patient requires sedative to perform foley placement. Given ativan. Needed haldol in addition.  Patient care turned over to oncoming provider. Patient case and results discussed in detail; please see their note for further ED managment.  Plan to DC with urology follow up.        Final Clinical Impression(s) / ED Diagnoses Final diagnoses:  Elevated blood pressure reading  Acute urinary retention           This chart was dictated using voice recognition software.  Despite best efforts to proofread,  errors can occur which can change the documentation meaning.    Nira Conn, MD 08/07/22 [phone]

## 2022-08-07 NOTE — ED Provider Triage Note (Signed)
Emergency Medicine Provider Triage Evaluation Note  Alan Owens , a 42 y.o. male  was evaluated in triage.  Pt presenting for HTN. Mother states he has been out of his Norvasc for a few days. Patient appeared restlessness tonight so she checked his BP and noted it to be high. No recent fevers, N/V, gait changes. Patient nonverbal at baseline.  Review of Systems  Positive: As above Negative: As above  Physical Exam  BP (!) 192/128   Pulse 93   Temp 97.7 F (36.5 C)   Resp 20   SpO2 97%  Gen:   Awake, no distress   Resp:  Normal effort  MSK:   Moves extremities without difficulty  Other:  Ambulates with minimal assistance. Nonverbal.   Medical Decision Making  Medically screening exam initiated at 12:36 AM.  Appropriate orders placed.  Alan Owens was informed that the remainder of the evaluation will be completed by another provider, this initial triage assessment does not replace that evaluation, and the importance of remaining in the ED until their evaluation is complete.  Uncontrolled HTN - out of medications for the past few days. Norvasc ordered. Patient nonverbal.   Alan Owens, Hershal Coria 08/07/22 0938

## 2022-08-09 ENCOUNTER — Telehealth: Payer: Self-pay | Admitting: Family Medicine

## 2022-08-09 NOTE — Telephone Encounter (Signed)
Earliest appointment I could find for patient is tomorrow 08/10/2022 @ 1:40pm.

## 2022-08-09 NOTE — Telephone Encounter (Signed)
Patient was seen in the hospital on Friday for elevated blood pressure. Per patients mom they gave him psychiatric medication to calm him down and other medication for his elevated blood pressure. Per patients mom patient pulled out his  foley catheter and has been out of it ever since he had the medication in the hospital and his Urologist advised for the pt to speak with Dr. Redmond Pulling.  Please call patients mother back.

## 2022-08-10 ENCOUNTER — Encounter: Payer: Self-pay | Admitting: Family Medicine

## 2022-08-10 ENCOUNTER — Ambulatory Visit (INDEPENDENT_AMBULATORY_CARE_PROVIDER_SITE_OTHER): Payer: Medicaid Other | Admitting: Family Medicine

## 2022-08-10 VITALS — BP 153/112 | HR 86 | Resp 16 | Wt 184.4 lb

## 2022-08-10 DIAGNOSIS — E6609 Other obesity due to excess calories: Secondary | ICD-10-CM | POA: Insufficient documentation

## 2022-08-10 DIAGNOSIS — I509 Heart failure, unspecified: Secondary | ICD-10-CM | POA: Diagnosis not present

## 2022-08-10 DIAGNOSIS — E7800 Pure hypercholesterolemia, unspecified: Secondary | ICD-10-CM | POA: Insufficient documentation

## 2022-08-10 DIAGNOSIS — H409 Unspecified glaucoma: Secondary | ICD-10-CM | POA: Insufficient documentation

## 2022-08-10 DIAGNOSIS — E782 Mixed hyperlipidemia: Secondary | ICD-10-CM | POA: Diagnosis not present

## 2022-08-10 DIAGNOSIS — I5022 Chronic systolic (congestive) heart failure: Secondary | ICD-10-CM | POA: Insufficient documentation

## 2022-08-10 DIAGNOSIS — Z6831 Body mass index (BMI) 31.0-31.9, adult: Secondary | ICD-10-CM | POA: Insufficient documentation

## 2022-08-10 DIAGNOSIS — R7303 Prediabetes: Secondary | ICD-10-CM | POA: Diagnosis not present

## 2022-08-10 DIAGNOSIS — I10 Essential (primary) hypertension: Secondary | ICD-10-CM | POA: Diagnosis not present

## 2022-08-10 DIAGNOSIS — K219 Gastro-esophageal reflux disease without esophagitis: Secondary | ICD-10-CM | POA: Insufficient documentation

## 2022-08-10 DIAGNOSIS — J45909 Unspecified asthma, uncomplicated: Secondary | ICD-10-CM | POA: Insufficient documentation

## 2022-08-10 DIAGNOSIS — I428 Other cardiomyopathies: Secondary | ICD-10-CM | POA: Insufficient documentation

## 2022-08-10 DIAGNOSIS — F418 Other specified anxiety disorders: Secondary | ICD-10-CM | POA: Insufficient documentation

## 2022-08-10 MED ORDER — FUROSEMIDE 20 MG PO TABS: 20.0000 mg | ORAL_TABLET | Freq: Every day | ORAL | 1 refills | Status: DC

## 2022-08-10 MED ORDER — CARVEDILOL 6.25 MG PO TABS: 6.2500 mg | ORAL_TABLET | Freq: Two times a day (BID) | ORAL | 1 refills | Status: DC

## 2022-08-10 MED ORDER — POTASSIUM CHLORIDE CRYS ER 20 MEQ PO TBCR: EXTENDED_RELEASE_TABLET | ORAL | 1 refills | Status: DC

## 2022-08-10 MED ORDER — ATORVASTATIN CALCIUM 20 MG PO TABS: ORAL_TABLET | ORAL | 3 refills | Status: DC

## 2022-08-10 MED ORDER — LISINOPRIL 10 MG PO TABS: ORAL_TABLET | ORAL | 1 refills | Status: DC

## 2022-08-10 MED ORDER — AMLODIPINE BESYLATE 2.5 MG PO TABS: 2.5000 mg | ORAL_TABLET | Freq: Every day | ORAL | 1 refills | Status: DC

## 2022-08-10 NOTE — Progress Notes (Unsigned)
New Patient Office Visit  Subjective    Patient ID: Alan Owens, male    DOB: Nov 14, 1980  Age: 42 y.o. MRN: [id number]  CC:  Chief Complaint  Patient presents with   Follow-up    HPI Alan Owens presents to establish care ***  Outpatient Encounter Medications as of 08/10/2022  Medication Sig   allopurinol (ZYLOPRIM) 100 MG tablet Take 2 tablets (200 mg total) by mouth daily.   ALPHAGAN P 0.1 % SOLN Apply 1 drop to eye in the morning and at bedtime.   ascorbic acid (VITAMIN C) 500 MG tablet Take 1 tablet (500 mg total) by mouth daily.   ASPIRIN LOW DOSE 81 MG EC tablet TAKE 1 TABLET BY MOUTH EVERY DAY   hydrOXYzine (ATARAX) 50 MG tablet TAKE 1 TABLET BY MOUTH EVERY DAY AT NIGHT   Ipratropium-Albuterol (COMBIVENT) 20-100 MCG/ACT AERS respimat Inhale 2 puffs into the lungs 2 (two) times daily. Use 2 times daily x 1 week, then as needed.   latanoprost (XALATAN) 0.005 % ophthalmic solution Place 1 drop into both eyes at bedtime.   omeprazole (PRILOSEC) 20 MG capsule 1 cap(s) orally once a day for 30 day(s)   Potassium Chloride ER 20 MEQ TBCR TAKE 1 TABLET BY MOUTH EVERY DAY   [DISCONTINUED] amLODipine (NORVASC) 2.5 MG tablet Take 1 tablet (2.5 mg total) by mouth daily.   [DISCONTINUED] atorvastatin (LIPITOR) 20 MG tablet TAKE 1 TABLET (20 MG TOTAL) BY MOUTH DAILY AT 6 PM.   [DISCONTINUED] carvedilol (COREG) 6.25 MG tablet TAKE 1 TABLET BY MOUTH 2 TIMES DAILY WITH A MEAL.   [DISCONTINUED] furosemide (LASIX) 20 MG tablet TAKE 1 TABLET BY MOUTH EVERY DAY   [DISCONTINUED] omeprazole (PRILOSEC) 40 MG capsule Take 1 capsule (40 mg total) by mouth daily.   [DISCONTINUED] potassium chloride SA (KLOR-CON M) 20 MEQ tablet 1 tab(s) orally daily   ALPHAGAN P 0.15 % ophthalmic solution 1 drop 2 (two) times daily. (Patient not taking: Reported on 07/31/2021)   amLODipine (NORVASC) 2.5 MG tablet 1 tab(s) orally once a day   amLODipine (NORVASC) 2.5 MG tablet Take 1 tablet (2.5 mg total) by  mouth daily.   Aspirin 81 MG CAPS 1 tab(s) orally once a day   atorvastatin (LIPITOR) 20 MG tablet TAKE 1 TABLET (20 MG TOTAL) BY MOUTH DAILY AT 6 PM.   carvedilol (COREG) 6.25 MG tablet Take 1 tablet (6.25 mg total) by mouth 2 (two) times daily with a meal.   FLUoxetine (PROZAC) 20 MG capsule 1 cap(s) orally once a day for 30 day(s)   furosemide (LASIX) 20 MG tablet Take 1 tablet (20 mg total) by mouth daily.   lisinopril (ZESTRIL) 10 MG tablet 1 tab(s) orally once a day for 30 day(s)   potassium chloride SA (KLOR-CON M) 20 MEQ tablet 1 tab(s) orally daily   zinc sulfate 220 (50 Zn) MG capsule Take 1 capsule (220 mg total) by mouth daily. (Patient not taking: Reported on 08/24/2021)   [DISCONTINUED] lisinopril (ZESTRIL) 10 MG tablet 1 tab(s) orally once a day for 30 day(s)   No facility-administered encounter medications on file as of 08/10/2022.    Past Medical History:  Diagnosis Date   Acid reflux    Blind left eye    Congestive heart failure ([place])    Hypercholesterolemia    Hypertension    Juvenile glaucoma    Mental developmental delay    Renal failure    Vitamin D deficiency  Past Surgical History:  Procedure Laterality Date   MULTIPLE TOOTH EXTRACTIONS Bilateral 2009   all teeth extracted per patient's mother    Family History  Problem Relation Age of Onset   Emphysema Mother    Anemia Sister    Hypertension Brother    Hypertension Brother    Diabetes Brother    Multiple sclerosis Maternal Grandmother    Hypertension Maternal Grandmother    Emphysema Maternal Grandfather    Lupus Maternal Uncle    Gout Maternal Uncle    Colon cancer Neg Hx    Esophageal cancer Neg Hx    Stomach cancer Neg Hx    Rectal cancer Neg Hx     Social History   Socioeconomic History   Marital status: Single    Spouse name: Not on file   Number of children: Not on file   Years of education: Not on file   Highest education level: Not on file  Occupational History   Not on  file  Tobacco Use   Smoking status: Never   Smokeless tobacco: Never  Vaping Use   Vaping Use: Never used  Substance and Sexual Activity   Alcohol use: Never   Drug use: Never   Sexual activity: Never  Other Topics Concern   Not on file  Social History Narrative   Not on file   Social Determinants of Health   Financial Resource Strain: Not on file  Food Insecurity: Not on file  Transportation Needs: Not on file  Physical Activity: Not on file  Stress: Not on file  Social Connections: Not on file  Intimate Partner Violence: Not on file    ROS      Objective    BP (!) 153/112   Pulse 86   Resp 16   Wt 184 lb 6.4 oz (83.6 kg)   SpO2 96%   BMI 27.63 kg/m   Physical Exam  {Labs (Optional):23779}    Assessment & Plan:   Problem List Items Addressed This Visit       Cardiovascular and Mediastinum   Congestive heart failure ([facility])   Relevant Medications   Aspirin 81 MG CAPS   amLODipine (NORVASC) 2.5 MG tablet   amLODipine (NORVASC) 2.5 MG tablet   atorvastatin (LIPITOR) 20 MG tablet   carvedilol (COREG) 6.25 MG tablet   furosemide (LASIX) 20 MG tablet   lisinopril (ZESTRIL) 10 MG tablet   Hypertension - Primary   Relevant Medications   Aspirin 81 MG CAPS   amLODipine (NORVASC) 2.5 MG tablet   amLODipine (NORVASC) 2.5 MG tablet   atorvastatin (LIPITOR) 20 MG tablet   carvedilol (COREG) 6.25 MG tablet   furosemide (LASIX) 20 MG tablet   lisinopril (ZESTRIL) 10 MG tablet     Other   Mixed hyperlipidemia   Relevant Medications   Aspirin 81 MG CAPS   amLODipine (NORVASC) 2.5 MG tablet   amLODipine (NORVASC) 2.5 MG tablet   atorvastatin (LIPITOR) 20 MG tablet   carvedilol (COREG) 6.25 MG tablet   furosemide (LASIX) 20 MG tablet   lisinopril (ZESTRIL) 10 MG tablet   Prediabetes    No follow-ups on file.   Becky Sax, MD

## 2022-08-10 NOTE — Progress Notes (Unsigned)
Patient is here for their 6 month follow-up Patient has no concerns today Care gaps have been discussed with patient  

## 2022-08-11 ENCOUNTER — Encounter: Payer: Medicaid Other | Admitting: Surgery

## 2022-08-11 ENCOUNTER — Ambulatory Visit: Payer: Medicaid Other | Admitting: Cardiovascular Disease

## 2022-08-11 ENCOUNTER — Telehealth: Payer: Self-pay | Admitting: Cardiovascular Disease

## 2022-08-11 NOTE — Telephone Encounter (Signed)
Patient was seen in office by PCP on 08/10/2022

## 2022-08-11 NOTE — Telephone Encounter (Signed)
Pt's medications were already sent to pt's pharmacy by pt's PCP.

## 2022-08-11 NOTE — Telephone Encounter (Signed)
*  STAT* If patient is at the pharmacy, call can be transferred to refill team.   1. Which medications need to be refilled? (please list name of each medication and dose if known)   furosemide (LASIX) 20 MG tablet  Potassium Chloride ER 20 MEQ TBCR   2. Which pharmacy/location (including street and city if local pharmacy) is medication to be sent to?  [id number] - [place], [place] - [address]   3. Do they need a 30 day or 90 day supply?   90 day  [name].

## 2022-08-12 ENCOUNTER — Encounter: Payer: Self-pay | Admitting: Family Medicine

## 2022-08-12 ENCOUNTER — Encounter: Payer: Medicaid Other | Admitting: Surgery

## 2022-08-18 ENCOUNTER — Institutional Professional Consult (permissible substitution): Payer: Medicaid Other | Admitting: Surgery

## 2022-08-18 ENCOUNTER — Encounter: Payer: Self-pay | Admitting: Surgery

## 2022-08-18 VITALS — BP 157/119 | HR 87 | Resp 18 | Ht 68.0 in | Wt 191.0 lb

## 2022-08-18 DIAGNOSIS — I7121 Aneurysm of the ascending aorta, without rupture: Secondary | ICD-10-CM

## 2022-08-18 NOTE — Progress Notes (Signed)
Cardiothoracic Surgery Consultation  PCP is Dorna Mai, MD Referring Provider is Josue Hector, MD  Chief Complaint  Patient presents with  . Thoracic Aortic Aneurysm    Chest CTA 07/20/22    HPI:   Past Medical History:  Diagnosis Date  . Acid reflux   . Blind left eye   . Congestive heart failure ([place])   . Hypercholesterolemia   . Hypertension   . Juvenile glaucoma   . Mental developmental delay   . Renal failure   . Vitamin D deficiency     Past Surgical History:  Procedure Laterality Date  . MULTIPLE TOOTH EXTRACTIONS Bilateral 2009   all teeth extracted per patient's mother    Family History  Problem Relation Age of Onset  . Emphysema Mother   . Anemia Sister   . Hypertension Brother   . Hypertension Brother   . Diabetes Brother   . Multiple sclerosis Maternal Grandmother   . Hypertension Maternal Grandmother   . Emphysema Maternal Grandfather   . Lupus Maternal Uncle   . Gout Maternal Uncle   . Colon cancer Neg Hx   . Esophageal cancer Neg Hx   . Stomach cancer Neg Hx   . Rectal cancer Neg Hx     Social History Social History   Tobacco Use  . Smoking status: Never  . Smokeless tobacco: Never  Vaping Use  . Vaping Use: Never used  Substance Use Topics  . Alcohol use: Never  . Drug use: Never    Current Outpatient Medications  Medication Sig Dispense Refill  . allopurinol (ZYLOPRIM) 100 MG tablet Take 2 tablets (200 mg total) by mouth daily. 180 tablet 1  . ALPHAGAN P 0.1 % SOLN Apply 1 drop to eye in the morning and at bedtime.    . ALPHAGAN P 0.15 % ophthalmic solution 1 drop 2 (two) times daily.    Marland Kitchen amLODipine (NORVASC) 2.5 MG tablet 1 tab(s) orally once a day    . amLODipine (NORVASC) 2.5 MG tablet Take 1 tablet (2.5 mg total) by mouth daily. 90 tablet 1  . ascorbic acid (VITAMIN C) 500 MG tablet Take 1 tablet (500 mg total) by mouth daily.    . Aspirin 81 MG CAPS 1 tab(s) orally once a day    . ASPIRIN LOW DOSE 81 MG EC tablet  TAKE 1 TABLET BY MOUTH EVERY DAY 30 tablet 6  . atorvastatin (LIPITOR) 20 MG tablet TAKE 1 TABLET (20 MG TOTAL) BY MOUTH DAILY AT 6 PM. 90 tablet 3  . carvedilol (COREG) 6.25 MG tablet Take 1 tablet (6.25 mg total) by mouth 2 (two) times daily with a meal. 180 tablet 1  . furosemide (LASIX) 20 MG tablet Take 1 tablet (20 mg total) by mouth daily. 90 tablet 1  . hydrOXYzine (ATARAX) 50 MG tablet TAKE 1 TABLET BY MOUTH EVERY DAY AT NIGHT 30 tablet 0  . Ipratropium-Albuterol (COMBIVENT) 20-100 MCG/ACT AERS respimat Inhale 2 puffs into the lungs 2 (two) times daily. Use 2 times daily x 1 week, then as needed. 4 g 1  . latanoprost (XALATAN) 0.005 % ophthalmic solution Place 1 drop into both eyes at bedtime. 2.5 mL 1  . omeprazole (PRILOSEC) 20 MG capsule 1 cap(s) orally once a day for 30 day(s)    . Potassium Chloride ER 20 MEQ TBCR TAKE 1 TABLET BY MOUTH EVERY DAY 90 tablet 0   No current facility-administered medications for this visit.    No Known Allergies  Review of Systems  BP (!) 157/119 (BP Location: Left Arm, Patient Position: Sitting)   Pulse 87   Resp 18   Ht 5\' 8"  (1.727 m)   Wt 191 lb (86.6 kg)   SpO2 97% Comment: RA  BMI 29.04 kg/m  Physical Exam   Diagnostic Tests:   Impression:   Plan:   Gaye Pollack, MD [facility] [phone]

## 2022-08-24 ENCOUNTER — Ambulatory Visit: Payer: Medicaid Other | Admitting: Family Medicine

## 2022-08-24 NOTE — Progress Notes (Signed)
Date:  08/24/2022   ID:  Alan Owens, Alan Owens 1980/10/16, MRN [id number]   PCP:  Dorna Mai, MD  Cardiologist: Johnsie Cancel Electrophysiologist:  None   History of Present Illness:    Alan Owens is a 42 y.o. male has a history of congenital heart disease. 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 Needs aid for feeding. Mother cares for him but known little history. Moved here from [place]. On Medicaid. On both HTN and HLD medicines.   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 Echo was updated 08/09/18  and EF was 45 to 50%. He was continued on medical management.    Gout foot used allopurinol and steroids sees Rheumatology Dr Benjamine Mola  He walks a lot and has a tablet for entertainment Also has a dog at home   TTE 08/18/21 EF stable 45-50%  CTA 07/20/22 aorta 5.3 cm but motion artifact   Dr Caffie Pinto felt observation still warranted  His BP is elevated at  home and in our office today   Past Medical History:  Diagnosis Date   Acid reflux    Blind left eye    Congestive heart failure ([place])    Hypercholesterolemia    Hypertension    Juvenile glaucoma    Mental developmental delay    Renal failure    Vitamin D deficiency    Past Surgical History:  Procedure Laterality Date   MULTIPLE TOOTH EXTRACTIONS Bilateral 2009   all teeth extracted per patient's mother     No outpatient medications have been marked as taking for the 09/07/22 encounter (Appointment) with Josue Hector, MD.     Allergies:   Patient has no known allergies.   Social History   Tobacco Use   Smoking status: Never   Smokeless tobacco: Never  Vaping Use   Vaping Use: Never used  Substance Use Topics   Alcohol use: Never   Drug use: Never     Family Hx: The patient's family history includes Anemia in his sister; Diabetes in his brother; Emphysema in his maternal grandfather and mother; Gout in his maternal uncle;  Hypertension in his brother, brother, and maternal grandmother; Lupus in his maternal uncle; Multiple sclerosis in his maternal grandmother. There is no history of Colon cancer, Esophageal cancer, Stomach cancer, or Rectal cancer.  ROS:   Please see the history of present illness.   All other systems reviewed are negative.    Objective:    Vital Signs:  There were no vitals taken for this visit.   Wt Readings from Last 3 Encounters:  08/18/22 191 lb (86.6 kg)  08/10/22 184 lb 6.4 oz (83.6 kg)  08/24/21 200 lb (90.7 kg)    Overweight black male  Non verbal  HEENT: normal Neck supple with no adenopathy JVP normal no bruits no thyromegaly Lungs clear with no wheezing and good diaphragmatic motion Heart:  S1/S2 no murmur, no rub, gallop or click PMI increased  Abdomen: benighn, BS positve, no tenderness, no AAA no bruit.  No HSM or HJR Distal pulses intact with no bruits No edema Neuro non-focal Skin warm and dry No muscular weakness   Labs/Other Tests and Data Reviewed:    Lab Results  Component Value Date   WBC 7.5 08/07/2022   HGB 12.7 (L) 08/07/2022   HCT 36.9 (L) 08/07/2022   PLT 250 08/07/2022   GLUCOSE 104 (  H) 08/07/2022   CHOL 223 (H) 08/14/2020   TRIG 228 (H) 08/14/2020   HDL 38 (L) 08/14/2020   LDLCALC 144 (H) 08/14/2020   ALT 10 02/10/2021   AST 9 (L) 02/10/2021   NA 139 08/07/2022   K 3.1 (L) 08/07/2022   CL 103 08/07/2022   CREATININE 1.17 08/07/2022   BUN 7 08/07/2022   CO2 28 08/07/2022   TSH 2.030 12/12/2019   HGBA1C 5.7 (A) 11/19/2020      Prior CV studies:    The following studies were reviewed today:   ECHO IMPRESSIONS 08/18/21   IMPRESSIONS    1. Left ventricular ejection fraction, by estimation, is 45 to 50%. The left ventricle has mildly decreased function. The left ventricle demonstrates global hypokinesis. There is mild concentric left ventricular hypertrophy. Left ventricular diastolic parameters are consistent with Grade I  diastolic dysfunction (impaired relaxation).  2. Right ventricular systolic function is normal. The right ventricular size is normal.  3. The mitral valve is normal in structure. Trivial mitral valve regurgitation. No evidence of mitral stenosis.  4. The aortic valve is tricuspid. Aortic valve regurgitation is mild. No aortic stenosis is present.  5. Aneurysm of the aortic root, measuring 46 mm.  6. The inferior vena cava is normal in size with greater than 50% respiratory variability, suggesting right atrial pressure of 3 mmHg.       ECG:  SR rate 91 normal 12/12/19 08/24/2022 SR rate 88 LVH no changes    ASSESSMENT & PLAN:     1. Dilated CM - doing well with current meds and low sodium diet EF 45-50% by echo 08/18/21 stable Continue coreg/lasix Not on ACE/ARB/ARNI with history of renal failure Cr as high as 3.7  most recent1/27/24 Cr 1.17  3 HTN - BP seems elevated increase norvasc to 5 mg and coreg to 12.5 mg bid   4. HLD - on statin - needs lab work   5. Gout:  on allopurinol left foot improved f/u Dr Benjamine Mola Couldn't afford colchicine on steroids wean per Rheumatology   6.  Aortic Aneurysm:  4.6-5.3 cm with motion artifact on last CTA 1024 F/U gated chest CTA July and f/u with Dr Cyndia Bent     Medication Adjustments/Labs and Tests Ordered: Current medicines are reviewed at length with the patient today.  Concerns regarding medicines are outlined above.   Tests Ordered:  None  Medication Changes: No orders of the defined types were placed in this encounter. See above increase norvasc to 5 mg and coreg to 12.5 mg    Disposition:  FU with [place] in a year   Patient is agreeable to this plan and will call if any problems develop in the interim.   Signed,  Jenkins Rouge, MD  08/24/2022 2:04 PM    [place]

## 2022-09-05 ENCOUNTER — Other Ambulatory Visit: Payer: Self-pay | Admitting: Family Medicine

## 2022-09-05 ENCOUNTER — Other Ambulatory Visit: Payer: Self-pay | Admitting: Internal Medicine

## 2022-09-05 DIAGNOSIS — M1A9XX0 Chronic gout, unspecified, without tophus (tophi): Secondary | ICD-10-CM

## 2022-09-07 ENCOUNTER — Ambulatory Visit: Payer: Medicaid Other | Attending: Cardiovascular Disease | Admitting: Cardiovascular Disease

## 2022-09-07 ENCOUNTER — Encounter: Payer: Self-pay | Admitting: Cardiovascular Disease

## 2022-09-07 VITALS — BP 158/86 | HR 87 | Ht 68.0 in | Wt 192.0 lb

## 2022-09-07 DIAGNOSIS — I77810 Thoracic aortic ectasia: Secondary | ICD-10-CM | POA: Diagnosis present

## 2022-09-07 DIAGNOSIS — I420 Dilated cardiomyopathy: Secondary | ICD-10-CM | POA: Diagnosis present

## 2022-09-07 DIAGNOSIS — I517 Cardiomegaly: Secondary | ICD-10-CM

## 2022-09-07 MED ORDER — CARVEDILOL 12.5 MG PO TABS: 12.5000 mg | ORAL_TABLET | Freq: Two times a day (BID) | ORAL | 3 refills | Status: AC

## 2022-09-07 MED ORDER — AMLODIPINE BESYLATE 5 MG PO TABS: 5.0000 mg | ORAL_TABLET | Freq: Every day | ORAL | 3 refills | Status: DC

## 2022-09-07 NOTE — Patient Instructions (Signed)
Medication Instructions:  Your physician has recommended you make the following change in your medication:  1- INCREASE amlodipine 5 mg by mouth daily 2- INCREASE carvedilol 12. 5 mg by mouth twice daily.   *If you need a refill on your cardiac medications before your next appointment, please call your pharmacy*  Lab Work: If you have labs (blood work) drawn today and your tests are completely normal, you will receive your results only by: [place] (if you have MyChart) OR A paper copy in the mail If you have any lab test that is abnormal or we need to change your treatment, we will call you to review the results.  Follow-Up: At [facility], you and your health needs are our priority.  As part of our continuing mission to provide you with exceptional heart care, we have created designated Provider Care Teams.  These Care Teams include your primary Cardiologist (physician) and Advanced Practice Providers (APPs -  Physician Assistants and Nurse Practitioners) who all work together to provide you with the care you need, when you need it.  We recommend signing up for the patient portal called "MyChart".  Sign up information is provided on this After Visit Summary.  MyChart is used to connect with patients for Virtual Visits (Telemedicine).  Patients are able to view lab/test results, encounter notes, upcoming appointments, etc.  Non-urgent messages can be sent to your provider as well.   To learn more about what you can do with MyChart, go to [email or web address].    Your next appointment:   1 year(s)  Provider:   Jenkins Rouge, MD

## 2022-10-18 ENCOUNTER — Other Ambulatory Visit: Payer: Self-pay | Admitting: Internal Medicine

## 2022-10-18 DIAGNOSIS — M1A9XX0 Chronic gout, unspecified, without tophus (tophi): Secondary | ICD-10-CM

## 2022-11-22 ENCOUNTER — Other Ambulatory Visit: Payer: Self-pay | Admitting: Internal Medicine

## 2022-11-22 DIAGNOSIS — M1A9XX0 Chronic gout, unspecified, without tophus (tophi): Secondary | ICD-10-CM

## 2022-11-23 ENCOUNTER — Other Ambulatory Visit: Payer: Self-pay | Admitting: Family Medicine

## 2022-11-23 NOTE — Telephone Encounter (Signed)
Requested Prescriptions  Pending Prescriptions Disp Refills   potassium chloride SA (KLOR-CON M20) 20 MEQ tablet [Pharmacy Med Name: KLOR-CON M20 TABLET] 90 tablet 1    Sig: 1 TAB(S) ORALLY DAILY     Endocrinology:  Minerals - Potassium Supplementation Failed - 11/23/2022  4:07 PM      Failed - K in normal range and within 360 days    Potassium  Date Value Ref Range Status  08/07/2022 3.1 (L) 3.5 - 5.1 mmol/L Final         Passed - Cr in normal range and within 360 days    Creat  Date Value Ref Range Status  02/10/2021 1.19 0.60 - 1.29 mg/dL Final   Creatinine, Ser  Date Value Ref Range Status  08/07/2022 1.17 0.61 - 1.24 mg/dL Final   Creatinine, Urine  Date Value Ref Range Status  07/16/2020 321.85 mg/dL Final    Comment:    Performed at [facility], [address], [place], [place] [address]         Passed - Valid encounter within last 12 months    Recent Outpatient Visits           3 months ago Hypertension, unspecified type   [place] [facility] at [facility], MD   1 year ago Hypertension, unspecified type   [place] [facility] at [facility], MD   2 years ago Left foot pain   [place] [facility] at [facility] [place], NP   2 years ago Encounter for annual physical exam   [place] [facility] at [facility], Kandee Keen, MD   2 years ago Unexplained weight gain   [facility] at [facility], Kandee Keen, MD       Future Appointments             In 2 months Georganna Skeans, MD [facility] at [facility]             hydrOXYzine (ATARAX) 50 MG tablet [Pharmacy Med Name: HYDROXYZINE HCL 50 MG TABLET] 30 tablet 2    Sig: TAKE 1 TABLET BY MOUTH EVERY DAY AT NIGHT     Ear, Nose, and Throat:  Antihistamines 2 Passed - 11/23/2022  4:07 PM      Passed - Cr in normal range and within 360 days    Creat  Date Value Ref  Range Status  02/10/2021 1.19 0.60 - 1.29 mg/dL Final   Creatinine, Ser  Date Value Ref Range Status  08/07/2022 1.17 0.61 - 1.24 mg/dL Final   Creatinine, Urine  Date Value Ref Range Status  07/16/2020 321.85 mg/dL Final    Comment:    Performed at [facility], [address] [place], [place] [address]         Passed - Valid encounter within last 12 months    Recent Outpatient Visits           3 months ago Hypertension, unspecified type   [place] [facility] at [facility], MD   1 year ago Hypertension, unspecified type   [place] [facility] at [facility], MD   2 years ago Left foot pain    Primary Care at [facility], [place], [place]   2 years ago Encounter for annual physical exam   [facility] at [facility], Kandee Keen, MD  2 years ago Unexplained weight gain   [facility] at [facility], Kandee Keen, MD       Future Appointments             In 2 months Georganna Skeans, MD [facility] Care at [facility]

## 2022-11-23 NOTE — Telephone Encounter (Signed)
Requested Prescriptions  Pending Prescriptions Disp Refills   potassium chloride SA (KLOR-CON M20) 20 MEQ tablet 90 tablet 1    Sig: 1 TAB(S) ORALLY DAILY     Endocrinology:  Minerals - Potassium Supplementation Failed - 11/23/2022  4:07 PM      Failed - K in normal range and within 360 days    Potassium  Date Value Ref Range Status  08/07/2022 3.1 (L) 3.5 - 5.1 mmol/L Final         Passed - Cr in normal range and within 360 days    Creat  Date Value Ref Range Status  02/10/2021 1.19 0.60 - 1.29 mg/dL Final   Creatinine, Ser  Date Value Ref Range Status  08/07/2022 1.17 0.61 - 1.24 mg/dL Final   Creatinine, Urine  Date Value Ref Range Status  07/16/2020 321.85 mg/dL Final    Comment:    Performed at [facility], [address], [place], [place] [address]         Passed - Valid encounter within last 12 months    Recent Outpatient Visits           3 months ago Hypertension, unspecified type   [place] [facility] at [facility], MD   1 year ago Hypertension, unspecified type   [place] [facility] at [facility], MD   2 years ago Left foot pain   [place] [facility] at [facility], [place], NP   2 years ago Encounter for annual physical exam   [place] [facility] at [facility], Kandee Keen, MD   2 years ago Unexplained weight gain   [facility] at [facility], Kandee Keen, MD       Future Appointments             In 2 months Georganna Skeans, MD [facility] at [facility]            Signed Prescriptions Disp Refills   hydrOXYzine (ATARAX) 50 MG tablet 30 tablet 2    Sig: TAKE 1 TABLET BY MOUTH EVERY DAY AT NIGHT     Ear, Nose, and Throat:  Antihistamines 2 Passed - 11/23/2022  4:07 PM      Passed - Cr in normal range and within 360 days    Creat  Date Value Ref Range Status  02/10/2021 1.19 0.60 - 1.29 mg/dL Final    Creatinine, Ser  Date Value Ref Range Status  08/07/2022 1.17 0.61 - 1.24 mg/dL Final   Creatinine, Urine  Date Value Ref Range Status  07/16/2020 321.85 mg/dL Final    Comment:    Performed at [facility], [address], [place], [place] [address]         Passed - Valid encounter within last 12 months    Recent Outpatient Visits           3 months ago Hypertension, unspecified type   [place] [facility] at [facility], MD   1 year ago Hypertension, unspecified type   [place] [facility] at [facility], MD   2 years ago Left foot pain   [place] [facility] at [facility], [place], NP   2 years ago Encounter for annual physical exam   [place] [facility] at [facility], Kandee Keen, MD   2 years ago Unexplained weight gain  [facility] at [facility], Kandee Keen, MD       Future Appointments             In 2 months Georganna Skeans, MD [facility] Primary Care at [facility]

## 2022-12-27 ENCOUNTER — Other Ambulatory Visit: Payer: Self-pay | Admitting: Internal Medicine

## 2022-12-27 ENCOUNTER — Other Ambulatory Visit: Payer: Self-pay | Admitting: Family Medicine

## 2022-12-27 DIAGNOSIS — M1A9XX0 Chronic gout, unspecified, without tophus (tophi): Secondary | ICD-10-CM

## 2023-01-19 ENCOUNTER — Other Ambulatory Visit: Payer: Self-pay | Admitting: Surgery

## 2023-01-19 DIAGNOSIS — I7121 Aneurysm of the ascending aorta, without rupture: Secondary | ICD-10-CM

## 2023-01-23 ENCOUNTER — Other Ambulatory Visit: Payer: Self-pay | Admitting: Family Medicine

## 2023-01-24 NOTE — Telephone Encounter (Signed)
Requested medication (s) are due for refill today: Yes  Requested medication (s) are on the active medication list: Yes  Last refill:  08/10/22 #90 1RF  Future visit scheduled: Yes  Notes to clinic:  Unable to refill per protocol due to failed labs, no updated results. Rx d/ced 08/18/22     Requested Prescriptions  Pending Prescriptions Disp Refills   KLOR-CON M20 20 MEQ tablet [Pharmacy Med Name: KLOR-CON M20 TABLET] 90 tablet 1    Sig: 1 TAB(S) ORALLY DAILY     Endocrinology:  Minerals - Potassium Supplementation Failed - 01/23/2023 12:35 PM      Failed - K in normal range and within 360 days    Potassium  Date Value Ref Range Status  08/07/2022 3.1 (L) 3.5 - 5.1 mmol/L Final         Passed - Cr in normal range and within 360 days    Creat  Date Value Ref Range Status  02/10/2021 1.19 0.60 - 1.29 mg/dL Final   Creatinine, Ser  Date Value Ref Range Status  08/07/2022 1.17 0.61 - 1.24 mg/dL Final   Creatinine, Urine  Date Value Ref Range Status  07/16/2020 321.85 mg/dL Final    Comment:    Performed at [facility], [address] [place], [place] [address]         Passed - Valid encounter within last 12 months    Recent Outpatient Visits           5 months ago Hypertension, unspecified type   [place] [facility] at [facility], MD   1 year ago Hypertension, unspecified type   [place] [facility] at [facility], MD   2 years ago Left foot pain   [place] [facility] at [facility], [place], NP   2 years ago Encounter for annual physical exam   [place] [facility] at [facility], Kandee Keen, MD   2 years ago Unexplained weight gain   [facility] at [facility], Kandee Keen, MD       Future Appointments             In 2 weeks Georganna Skeans, MD [facility] Primary Care at [facility]

## 2023-02-08 ENCOUNTER — Ambulatory Visit: Payer: Medicaid Other | Admitting: Family Medicine

## 2023-02-16 ENCOUNTER — Other Ambulatory Visit: Payer: Self-pay | Admitting: Family Medicine

## 2023-02-24 ENCOUNTER — Other Ambulatory Visit: Payer: Self-pay | Admitting: Family Medicine

## 2023-03-09 ENCOUNTER — Ambulatory Visit
Admission: RE | Admit: 2023-03-09 | Discharge: 2023-03-09 | Disposition: A | Discharge status: Home / Self Care | Payer: MEDICAID | Source: Ambulatory Visit | Attending: Surgery | Admitting: Surgery

## 2023-03-09 ENCOUNTER — Ambulatory Visit: Payer: MEDICAID | Admitting: Surgery

## 2023-03-09 DIAGNOSIS — I7121 Aneurysm of the ascending aorta, without rupture: Secondary | ICD-10-CM

## 2023-03-09 MED ORDER — IOPAMIDOL (ISOVUE-370) INJECTION 76%
500.0000 mL | Freq: Once | INTRAVENOUS | Status: AC | PRN
  Administered 2023-03-09: 100 mL via INTRAVENOUS

## 2023-03-11 NOTE — Progress Notes (Signed)
Patient ID: Alan Owens, male   DOB: 1980/10/21, 42 y.o.   MRN: [id number]      [address]       Alan Owens [address]             [phone]     CARDIOTHORACIC SURGERY TELEPHONE VIRTUAL OFFICE NOTE  Referring Provider is Wendall Stade, MD Primary Cardiologist is Charlton Haws, MD PCP is Georganna Skeans, MD   HPI:  The patient is a 42 year old gentleman with history of hypertension, hyperlipidemia, mental developmental delay and nonverbal since birth, and partial blindness who has a history of nonischemic cardiomyopathy with ejection fraction of 40 to 45% in 2019.  He has been treated with medical management and was seen by Dr. Eden Emms in January 2023.  He is cared for by his mother. He had a follow-up echocardiogram on 08/18/2021 showing a trileaflet aortic valve with mild aortic insufficiency.  An AI pressure half-time was 384 ms.  There was no aortic stenosis.  The aortic root was noted to be dilated at 4.6 cm.  Left ventricular ejection fraction was 45 to 50% with global hypokinesis and mild concentric LVH with grade 1 diastolic dysfunction. A CTA of the chest on 07/20/2022 was read as showing an aortic root diameter of 5.3 cm.  There was significant motion artifact on the scan.  I saw him in initial consultation on 08/18/2022 and recommended that we do a follow-up study in 6 months.  He and his mother apparently went to get the CT scan done but then did not have a ride to our office and could only get home.  His mother asked that I call to do a telephone visit.  I spoke with the mother of  Alan Owens (DOB Dec 12, 1980 ) via telephone on 03/10/2023 at 3:30 PM and verified that I was speaking with the correct person using more than one form of identification.  We discussed the fact that I was contacting them from my office and they were located at home, as well as the reason(s) for conducting our visit virtually instead of in-person.  The patient's mother expressed  understanding the circumstances and agreed to proceed as described.   His mother said that he continues to feel well and has noted no change to his medical status.   Current Outpatient Medications  Medication Sig Dispense Refill   allopurinol (ZYLOPRIM) 100 MG tablet Take 2 tablets (200 mg total) by mouth daily. 180 tablet 1   ALPHAGAN P 0.1 % SOLN Apply 1 drop to eye in the morning and at bedtime.     ALPHAGAN P 0.15 % ophthalmic solution 1 drop 2 (two) times daily.     amLODipine (NORVASC) 5 MG tablet Take 1 tablet (5 mg total) by mouth daily. 90 tablet 3   ascorbic acid (VITAMIN C) 500 MG tablet Take 1 tablet (500 mg total) by mouth daily.     Aspirin 81 MG CAPS 1 tab(s) orally once a day     ASPIRIN LOW DOSE 81 MG EC tablet TAKE 1 TABLET BY MOUTH EVERY DAY 30 tablet 6   atorvastatin (LIPITOR) 20 MG tablet TAKE 1 TABLET (20 MG TOTAL) BY MOUTH DAILY AT 6 PM. 90 tablet 3   carvedilol (COREG) 12.5 MG tablet Take 1 tablet (12.5 mg total) by mouth 2 (two) times daily with a meal. 180 tablet 3   carvedilol (COREG) 6.25 MG tablet TAKE 1 TABLET BY MOUTH 2 TIMES DAILY WITH A MEAL. 180  tablet 0   furosemide (LASIX) 20 MG tablet Take 1 tablet (20 mg total) by mouth daily. 90 tablet 1   hydrOXYzine (ATARAX) 50 MG tablet TAKE 1 TABLET BY MOUTH EVERY DAY AT NIGHT 30 tablet 2   Ipratropium-Albuterol (COMBIVENT) 20-100 MCG/ACT AERS respimat Inhale 2 puffs into the lungs 2 (two) times daily. Use 2 times daily x 1 week, then as needed. 4 g 1   latanoprost (XALATAN) 0.005 % ophthalmic solution Place 1 drop into both eyes at bedtime. 2.5 mL 1   omeprazole (PRILOSEC) 20 MG capsule 1 cap(s) orally once a day for 30 day(s)     Potassium Chloride ER 20 MEQ TBCR TAKE 1 TABLET BY MOUTH EVERY DAY 90 tablet 0   No current facility-administered medications for this visit.     Diagnostic Tests:  Narrative & Impression  CLINICAL DATA:  42 year old male, follow-up ascending thoracic aortic aneurysm.    EXAM: CT ANGIOGRAPHY CHEST WITH CONTRAST   TECHNIQUE: Multidetector CT imaging of the chest was performed using the standard protocol during bolus administration of intravenous contrast. Multiplanar CT image reconstructions and MIPs were obtained to evaluate the vascular anatomy.   RADIATION DOSE REDUCTION: This exam was performed according to the departmental dose-optimization program which includes automated exposure control, adjustment of the mA and/or kV according to patient size and/or use of iterative reconstruction technique.   CONTRAST:  Seventy-five mL Isovue 370, intravenous   COMPARISON:  07/20/2022, 11/29/2003   FINDINGS: Cardiovascular: Preferential opacification of the thoracic aorta. Evaluation of the aortic root is mildly limited by motion artifact. No evidence of thoracic aortic dissection. Normal heart size. No pericardial effusion.   Sinues of Valsalva: 38 mm 43 x 37 mm ,unchanged and grossly stable since 2005 comparison   Sinotubular Junction: 38 mm ,unchanged   Ascending Aorta: 35 mm ,unchanged   Aortic Arch: 32 mm ,unchanged   Descending aorta: 23 mm at the level of the carina ,unchanged   Branch vessels: The left vertebral artery arises directly from the aortic arch just proximal to the left subclavian ostium. No significant atherosclerotic changes.   Coronary arteries: Normal origins and courses. No significant atherosclerotic calcifications.   Main pulmonary artery: 28 mm ,unchanged. No evidence of central pulmonary embolism.   Pulmonary veins: No anomalous pulmonary venous return. No evidence of left atrial appendage thrombus.   Upper abdominal vasculature: Within normal limits.   Mediastinum/Nodes: No enlarged mediastinal, hilar, or axillary lymph nodes. Thyroid gland, trachea, and esophagus demonstrate no significant findings.   Lungs/Pleura: No focal consolidations. No suspicious pulmonary nodules. No pleural effusion or  pneumothorax.   Upper Abdomen: The visualized upper abdomen is within normal limits.   Musculoskeletal: Similar appearing sigmoid curvature of the thoracic spine. No acute osseous abnormality.   Review of the MIP images confirms the above findings.   IMPRESSION: Vascular:   Similar appearing mild aortic root dilation measuring no greater than 4.3 cm. By my measurements, this is unchanged from recent (07/20/2022) and remote (11/29/2003) comparisons.   Non-Vascular:   No acute or significant intrathoracic abnormality.   Marliss Coots, MD   [facility]   [facility]     Electronically Signed   By: Marliss Coots M.D.   On: 03/09/2023 12:51       Impression:  His aortic root diameter is 4.3 cm by CTA of the chest.  This is stable and well below the surgical threshold of 5.5 cm.  I reviewed the results with the  patient's mother.  I stressed the importance of good blood pressure control in preventing further enlargement and acute aortic dissection.  I discussed the importance of avoiding strenuous physical activity or heavy lifting that may require a Valsalva maneuver and could suddenly raise his blood pressure to high levels.  I advised her against giving him fluoroquinolone antibiotics that have been associated with aneurysms.  Plan:  I will plan to see him back in 1 year with a CTA of the chest for aortic surveillance.    I discussed limitations of evaluation and management via telephone.  The patient was advised to call back for repeat telephone consultation or to seek an in-person evaluation if questions arise or the patient's clinical condition changes in any significant manner.  I spent 10 minutes of non-face-to-face time during the conduct of this telephone virtual office consultation, including pre-visit review of the patient's records and direct conversation with the patient's mother.     Alan Borne,  MD 03/10/2023

## 2023-03-15 ENCOUNTER — Encounter: Payer: Self-pay | Admitting: Family Medicine

## 2023-03-15 ENCOUNTER — Ambulatory Visit (INDEPENDENT_AMBULATORY_CARE_PROVIDER_SITE_OTHER): Payer: MEDICAID | Admitting: Family Medicine

## 2023-03-15 VITALS — BP 133/88 | HR 78 | Temp 98.1°F | Resp 16 | Wt 199.8 lb

## 2023-03-15 DIAGNOSIS — E782 Mixed hyperlipidemia: Secondary | ICD-10-CM

## 2023-03-15 DIAGNOSIS — I10 Essential (primary) hypertension: Secondary | ICD-10-CM | POA: Diagnosis not present

## 2023-03-15 DIAGNOSIS — R7303 Prediabetes: Secondary | ICD-10-CM

## 2023-03-15 MED ORDER — POTASSIUM CHLORIDE ER 20 MEQ PO TBCR: 1.0000 | EXTENDED_RELEASE_TABLET | Freq: Every day | ORAL | 0 refills | Status: DC

## 2023-03-15 NOTE — Progress Notes (Signed)
Patient is here for their 6 month follow-up Patient has no concerns today Care gaps have been discussed with patient Patient mom is with patient

## 2023-03-15 NOTE — Progress Notes (Signed)
Established Patient Office Visit  Subjective    Patient ID: Alan Owens, male    DOB: February 21, 1981  Age: 42 y.o. MRN: [id number]  CC: No chief complaint on file.   HPI [name] presents for follow up of chronic med issues. Patient denies acute complaints.    Outpatient Encounter Medications as of 03/15/2023  Medication Sig   allopurinol (ZYLOPRIM) 100 MG tablet Take 2 tablets (200 mg total) by mouth daily.   ALPHAGAN P 0.1 % SOLN Apply 1 drop to eye in the morning and at bedtime.   ALPHAGAN P 0.15 % ophthalmic solution 1 drop 2 (two) times daily.   amLODipine (NORVASC) 5 MG tablet Take 1 tablet (5 mg total) by mouth daily.   ascorbic acid (VITAMIN C) 500 MG tablet Take 1 tablet (500 mg total) by mouth daily.   Aspirin 81 MG CAPS 1 tab(s) orally once a day   ASPIRIN LOW DOSE 81 MG EC tablet TAKE 1 TABLET BY MOUTH EVERY DAY   atorvastatin (LIPITOR) 20 MG tablet TAKE 1 TABLET (20 MG TOTAL) BY MOUTH DAILY AT 6 PM.   carvedilol (COREG) 12.5 MG tablet Take 1 tablet (12.5 mg total) by mouth 2 (two) times daily with a meal.   carvedilol (COREG) 6.25 MG tablet TAKE 1 TABLET BY MOUTH 2 TIMES DAILY WITH A MEAL.   furosemide (LASIX) 20 MG tablet Take 1 tablet (20 mg total) by mouth daily.   hydrOXYzine (ATARAX) 50 MG tablet TAKE 1 TABLET BY MOUTH EVERY DAY AT NIGHT   Ipratropium-Albuterol (COMBIVENT) 20-100 MCG/ACT AERS respimat Inhale 2 puffs into the lungs 2 (two) times daily. Use 2 times daily x 1 week, then as needed.   latanoprost (XALATAN) 0.005 % ophthalmic solution Place 1 drop into both eyes at bedtime.   omeprazole (PRILOSEC) 20 MG capsule 1 cap(s) orally once a day for 30 day(s)   [DISCONTINUED] Potassium Chloride ER 20 MEQ TBCR TAKE 1 TABLET BY MOUTH EVERY DAY   Potassium Chloride ER 20 MEQ TBCR Take 1 tablet (20 mEq total) by mouth daily.   No facility-administered encounter medications on file as of 03/15/2023.    Past Medical History:  Diagnosis Date   Acid reflux     Blind left eye    Congestive heart failure (HCC)    Hypercholesterolemia    Hypertension    Juvenile glaucoma    Mental developmental delay    Renal failure    Vitamin D deficiency     Past Surgical History:  Procedure Laterality Date   MULTIPLE TOOTH EXTRACTIONS Bilateral 2009   all teeth extracted per patient's mother    Family History  Problem Relation Age of Onset   Emphysema Mother    Anemia Sister    Hypertension Brother    Hypertension Brother    Diabetes Brother    Multiple sclerosis Maternal Grandmother    Hypertension Maternal Grandmother    Emphysema Maternal Grandfather    Lupus Maternal Uncle    Gout Maternal Uncle    Colon cancer Neg Hx    Esophageal cancer Neg Hx    Stomach cancer Neg Hx    Rectal cancer Neg Hx     Social History   Socioeconomic History   Marital status: Single    Spouse name: Not on file   Number of children: Not on file   Years of education: Not on file   Highest education level: Not on file  Occupational History   Not on  file  Tobacco Use   Smoking status: Never   Smokeless tobacco: Never  Vaping Use   Vaping status: Never Used  Substance and Sexual Activity   Alcohol use: Never   Drug use: Never   Sexual activity: Never  Other Topics Concern   Not on file  Social History Narrative   Not on file   Social Determinants of Health   Financial Resource Strain: Not on file  Food Insecurity: Not on file  Transportation Needs: Not on file  Physical Activity: Not on file  Stress: Not on file  Social Connections: Not on file  Intimate Partner Violence: Not on file    Review of Systems  All other systems reviewed and are negative.       Objective    BP 133/88   Pulse 78   Temp 98.1 F (36.7 C) (Oral)   Resp 16   Wt 199 lb 12.8 oz (90.6 kg)   BMI 30.38 kg/m   Physical Exam Vitals and nursing note reviewed.  Constitutional:      General: He is not in acute distress. Cardiovascular:     Rate and  Rhythm: Normal rate and regular rhythm.  Pulmonary:     Effort: Pulmonary effort is normal.     Breath sounds: Normal breath sounds.  Abdominal:     Palpations: Abdomen is soft.     Tenderness: There is no abdominal tenderness.  Neurological:     General: No focal deficit present.     Mental Status: He is alert and oriented to person, place, and time.         Assessment & Plan:   1. Hypertension, unspecified type Appears stable. Continue   2. Mixed hyperlipidemia Continue   3. Prediabetes Continue      Return in about 6 months (around 09/12/2023) for follow up.   Alan Raymond, MD

## 2023-03-21 ENCOUNTER — Other Ambulatory Visit: Payer: Self-pay | Admitting: Family Medicine

## 2023-03-21 ENCOUNTER — Other Ambulatory Visit: Payer: Self-pay | Admitting: Internal Medicine

## 2023-03-21 DIAGNOSIS — M1A9XX0 Chronic gout, unspecified, without tophus (tophi): Secondary | ICD-10-CM

## 2023-06-20 ENCOUNTER — Other Ambulatory Visit: Payer: Self-pay | Admitting: Family Medicine

## 2023-06-20 DIAGNOSIS — I509 Heart failure, unspecified: Secondary | ICD-10-CM

## 2023-06-24 ENCOUNTER — Other Ambulatory Visit: Payer: Self-pay | Admitting: Family Medicine

## 2023-06-24 NOTE — Telephone Encounter (Signed)
Requested Prescriptions  Pending Prescriptions Disp Refills   Potassium Chloride ER 20 MEQ TBCR [Pharmacy Med Name: POTASSIUM CL ER 20 MEQ TABLET] 90 tablet 0    Sig: TAKE 1 TABLET BY MOUTH EVERY DAY     Endocrinology:  Minerals - Potassium Supplementation Failed - 06/24/2023  3:22 PM      Failed - K in normal range and within 360 days    Potassium  Date Value Ref Range Status  08/07/2022 3.1 (L) 3.5 - 5.1 mmol/L Final         Passed - Cr in normal range and within 360 days    Creat  Date Value Ref Range Status  02/10/2021 1.19 0.60 - 1.29 mg/dL Final   Creatinine, Ser  Date Value Ref Range Status  08/07/2022 1.17 0.61 - 1.24 mg/dL Final   Creatinine, Urine  Date Value Ref Range Status  07/16/2020 321.85 mg/dL Final    Comment:    Performed at [facility], [address] [place], [place] [address]         Passed - Valid encounter within last 12 months    Recent Outpatient Visits           3 months ago Hypertension, unspecified type   [facility], MD   10 months ago Hypertension, unspecified type   [facility], MD   1 year ago Hypertension, unspecified type   [facility], MD   2 years ago Left foot pain   [facility] [place], NP   2 years ago Encounter for annual physical exam   [facility], Kandee Keen, DO       Future Appointments             In 2 months Eden Emms, Noralyn Pick, MD [facility]   In 2 months Georganna Skeans, MD [facility]

## 2023-07-14 ENCOUNTER — Telehealth: Payer: Self-pay | Admitting: Family Medicine

## 2023-07-14 NOTE — Telephone Encounter (Signed)
 Patient dropped off document  [employer] [employer] Request for Prior Approval , to be filled out by provider. Patient requested to send it back via Fax within 5-days. Document is located in providers tray at front office.Please fax at  [phone]

## 2023-07-14 NOTE — Telephone Encounter (Signed)
 Provider has papers at her desk. Awaiting signature

## 2023-07-22 NOTE — Telephone Encounter (Signed)
 Alan Owens with [employer] called to check on status of form request. If completed Alan Owens, requesting for signed form to be re-faxed as they still have not received form, [phone].

## 2023-08-24 NOTE — Progress Notes (Deleted)
 Date:  08/24/2023   ID:  Alan Owens, Alan Owens Alan Owens Alan Owens, Alan Owens, MRN [id number]   PCP:  Georganna Skeans, MD  Cardiologist: Eden Emms Electrophysiologist:  None   History of Present Illness:    Alan Owens is a 43 y.o. male has a history of congenital heart disease. 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 Needs aid for feeding. Mother cares for him but known little history. Moved here from [place]. On Medicaid. On both HTN and HLD medicines.   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 Echo was updated 08/09/18  and EF was 45 to 50%. He was continued on medical management.    Gout foot used allopurinol and steroids sees Rheumatology Dr Dimple Casey  He walks a lot and has a tablet for entertainment Also has a dog at home   TTE 2/7/Alan Owens EF stable 45-50%  CTA 07/20/22 aorta 5.3 cm but motion artifact   Dr Lavinia Sharps felt observation still warranted  His BP is elevated at  home and in our office today   ***  Past Medical History:  Diagnosis Date   Acid reflux    Blind left eye    Congestive heart failure (HCC)    Hypercholesterolemia    Hypertension    Juvenile glaucoma    Mental developmental delay    Renal failure    Vitamin D deficiency    Past Surgical History:  Procedure Laterality Date   MULTIPLE TOOTH EXTRACTIONS Bilateral 2009   all teeth extracted per patient's mother     No outpatient medications have been marked as taking for the 09/02/23 encounter (Appointment) with Wendall Stade, MD.     Allergies:   Patient has no known allergies.   Social History   Tobacco Use   Smoking status: Never   Smokeless tobacco: Never  Vaping Use   Vaping status: Never Used  Substance Use Topics   Alcohol use: Never   Drug use: Never     Family Hx: The patient's family history includes Anemia in his sister; Diabetes in his brother; Emphysema in his maternal grandfather and mother; Gout in his maternal uncle;  Hypertension in his brother, brother, and maternal grandmother; Lupus in his maternal uncle; Multiple sclerosis in his maternal grandmother. There is no history of Colon cancer, Esophageal cancer, Stomach cancer, or Rectal cancer.  ROS:   Please see the history of present illness.   All other systems reviewed are negative.    Objective:    Vital Signs:  There were no vitals taken for this visit.   Wt Readings from Last 3 Encounters:  03/15/23 199 lb 12.8 oz (90.6 kg)  09/07/22 192 lb (87.1 kg)  08/18/22 191 lb (86.6 kg)    Overweight black male  Non verbal  HEENT: normal Neck supple with no adenopathy JVP normal no bruits no thyromegaly Lungs clear with no wheezing and good diaphragmatic motion Heart:  S1/S2 no murmur, no rub, gallop or click PMI increased  Abdomen: benighn, BS positve, no tenderness, no AAA no bruit.  No HSM or HJR Distal pulses intact with no bruits No edema Neuro non-focal Skin warm and dry No muscular weakness   Labs/Other Tests and Data Reviewed:    Lab Results  Component Value Date   WBC 7.5 08/07/2022   HGB 12.7 (L) 08/07/2022   HCT 36.9 (L) 08/07/2022   PLT 250 08/07/2022  GLUCOSE 104 (H) 08/07/2022   CHOL 223 (H) 08/14/2020   TRIG 228 (H) 08/14/2020   HDL 38 (L) 08/14/2020   LDLCALC 144 (H) 08/14/2020   ALT 10 02/10/2021   AST 9 (L) 02/10/2021   NA 139 08/07/2022   K 3.1 (L) 08/07/2022   CL 103 08/07/2022   CREATININE 1.17 08/07/2022   BUN 7 08/07/2022   CO2 28 08/07/2022   TSH 2.030 12/12/2019   HGBA1C 5.7 (A) 11/19/2020      Prior CV studies:    The following studies were reviewed today:   ECHO IMPRESSIONS 2/7/Alan Owens   IMPRESSIONS    1. Left ventricular ejection fraction, by estimation, is 45 to 50%. The left ventricle has mildly decreased function. The left ventricle demonstrates global hypokinesis. There is mild concentric left ventricular hypertrophy. Left ventricular diastolic parameters are consistent with Grade  I diastolic dysfunction (impaired relaxation).  2. Right ventricular systolic function is normal. The right ventricular size is normal.  3. The mitral valve is normal in structure. Trivial mitral valve regurgitation. No evidence of mitral stenosis.  4. The aortic valve is tricuspid. Aortic valve regurgitation is mild. No aortic stenosis is present.  5. Aneurysm of the aortic root, measuring 46 mm.  6. The inferior vena cava is normal in size with greater than 50% respiratory variability, suggesting right atrial pressure of 3 mmHg.    ECG:  SR rate 91 normal 12/12/19 08/24/2023 SR rate 88 LVH no changes    ASSESSMENT & PLAN:     1. Dilated CM - doing well with current meds and low sodium diet EF 45-50% by echo 2/7/Alan Owens stable Continue coreg/lasix Not on ACE/ARB/ARNI with history of renal failure Cr as high as 3.7  most recent 08/07/22 Cr 1.17  3 HTN - Last visit norvasc increased to 5 mg and coreg to 12.5 mg bid   4. HLD - on statin - needs lab work   5. Gout:  on allopurinol left foot improved f/u Dr Dimple Casey Couldn't afford colchicine on steroids wean per Rheumatology   6.  Aortic Aneurysm:  gated chest CTA 03/09/23 with no motion artifact ascending aorta only 4.3 cm stable F/U [facility]    Medication Adjustments/Labs and Tests Ordered: Current medicines are reviewed at length with the patient today.  Concerns regarding medicines are outlined above.   Tests Ordered:  None  Medication Changes: No orders of the defined types were placed in this encounter. See above increase norvasc to 5 mg and coreg to 12.5 mg    Disposition:  FU with [place] in a year   Patient is agreeable to this plan and will call if any problems develop in the interim.   Signed,  Charlton Haws, MD  08/24/2023 9:59 AM    [facility]

## 2023-09-02 ENCOUNTER — Ambulatory Visit: Payer: MEDICAID | Admitting: Cardiovascular Disease

## 2023-09-12 ENCOUNTER — Ambulatory Visit: Payer: MEDICAID | Admitting: Family Medicine

## 2023-09-19 ENCOUNTER — Other Ambulatory Visit: Payer: Self-pay | Admitting: Family Medicine

## 2023-09-19 ENCOUNTER — Other Ambulatory Visit: Payer: Self-pay | Admitting: Cardiovascular Disease

## 2023-09-25 NOTE — Progress Notes (Deleted)
 Cardiology Office Note    Patient Name: Alan Owens Date of Encounter: 09/25/2023  Primary Care Provider:  Georganna Skeans, MD Primary Cardiologist:  Charlton Haws, MD Primary Electrophysiologist: None   Past Medical History    Past Medical History:  Diagnosis Date   Acid reflux    Blind left eye    Congestive heart failure (HCC)    Hypercholesterolemia    Hypertension    Juvenile glaucoma    Mental developmental delay    Renal failure    Vitamin D deficiency     History of Present Illness  Alan Owens is a 43 y.o. male with a PMH of congenital heart disease and developmental delay with partial blindness and nonverbal since birth, HTN, HLD, NICM presents today for 1 year follow-up.  Mr. Glosser was seen initially by Dr. Eden Emms in 2020 after relocating from [place] and was noted to have DCM with EF of 40-45% on previous 2D echoes.  During visit he had no signs of CHF and his mother managed his medications with plan to follow-up with pulmonology due to aspirations.  He is also followed by rheumatologist for history of gout and was seen by Dr. Eden Emms on 07/31/2021 and was on a steroid taper and had completed an updated 2D echo on 08/18/2021 showing EF of 45-50% mildly decreased LV function and grade 1 DD with no significant valve abnormalities and aortic root aneurysm measuring 46 mm and underwent CT of the chest that showed no evidence of dissection with outer diameter 5.3 cm with motion artifact.  He was referred to [facility] for opinion and treatment options.  He was seen by Dr. Laneta Simmers who recommended ongoing surveillance.  He was last seen by Dr. Eden Emms on 09/07/2022 and was noted to have elevated blood pressure and Norvasc was increased to 5 mg and carvedilol to 12.5 mg twice daily.  He had a follow-up CT of the chest on 02/2023 that showed aneurysm unchanged and stable at 43 mm.  During today's visit the patient reports*** .  Patient denies chest pain, palpitations, dyspnea, PND,  orthopnea, nausea, vomiting, dizziness, syncope, edema, weight gain, or early satiety.  ***Notes: -Last ischemic evaluation: -Last echo: -Interim ED visits: Review of Systems  Please see the history of present illness.    All other systems reviewed and are otherwise negative except as noted above.  Physical Exam    Wt Readings from Last 3 Encounters:  03/15/23 199 lb 12.8 oz (90.6 kg)  09/07/22 192 lb (87.1 kg)  08/18/22 191 lb (86.6 kg)   [id number] were no vitals filed for this visit.,There is no height or weight on file to calculate BMI. GEN: Well nourished, well developed in no acute distress Neck: No JVD; No carotid bruits Pulmonary: Clear to auscultation without rales, wheezing or rhonchi  Cardiovascular: Normal rate. Regular rhythm. Normal S1. Normal S2.   Murmurs: There is no murmur.  ABDOMEN: Soft, non-tender, non-distended EXTREMITIES:  No edema; No deformity   EKG/LABS/ Recent Cardiac Studies   ECG personally reviewed by me today - ***  Risk Assessment/Calculations:   {Does this patient have ATRIAL FIBRILLATION?:[phone]}      Lab Results  Component Value Date   WBC 7.5 08/07/2022   HGB 12.7 (L) 08/07/2022   HCT 36.9 (L) 08/07/2022   MCV 89.8 08/07/2022   PLT 250 08/07/2022   Lab Results  Component Value Date   CREATININE 1.17 08/07/2022   BUN 7 08/07/2022   NA 139 08/07/2022   K  3.1 (L) 08/07/2022   CL 103 08/07/2022   CO2 28 08/07/2022   Lab Results  Component Value Date   CHOL 223 (H) 08/14/2020   HDL 38 (L) 08/14/2020   LDLCALC 144 (H) 08/14/2020   TRIG 228 (H) 08/14/2020   CHOLHDL 5.9 (H) 08/14/2020    Lab Results  Component Value Date   HGBA1C 5.7 (A) 11/19/2020   Assessment & Plan    1.  History of dilated CM: -2D echo completed showing EF of 45-50% on 08/18/2021.  2.  Aortic aneurysm: -Patient's most recent surveillance CT completed 02/2023 showing stable aneurysm at 4.3 mm  3.  Essential hypertension: -Patient's blood pressure  today was***  4.  Hyperlipidemia: -Patient's last LDL cholesterol was***      Disposition: Follow-up with Charlton Haws, MD or APP in *** months {Are you ordering a CV Procedure (e.g. stress test, cath, DCCV, TEE, etc)?   Press F2        :[id number]}   Signed, Napoleon Form, Leodis Rains, NP 09/25/2023, 2:02 PM [facility]

## 2023-09-26 ENCOUNTER — Ambulatory Visit: Payer: MEDICAID | Attending: Nurse Practitioner | Admitting: Nurse Practitioner

## 2023-09-26 DIAGNOSIS — I10 Essential (primary) hypertension: Secondary | ICD-10-CM

## 2023-09-26 DIAGNOSIS — E785 Hyperlipidemia, unspecified: Secondary | ICD-10-CM

## 2023-09-26 DIAGNOSIS — I77810 Thoracic aortic ectasia: Secondary | ICD-10-CM

## 2023-09-26 DIAGNOSIS — I420 Dilated cardiomyopathy: Secondary | ICD-10-CM

## 2023-09-27 ENCOUNTER — Encounter: Payer: Self-pay | Admitting: Nurse Practitioner

## 2023-09-28 ENCOUNTER — Other Ambulatory Visit: Payer: Self-pay | Admitting: Family Medicine

## 2023-10-11 ENCOUNTER — Ambulatory Visit: Payer: MEDICAID | Admitting: Family Medicine

## 2023-12-25 ENCOUNTER — Other Ambulatory Visit: Payer: Self-pay | Admitting: Family Medicine

## 2023-12-25 ENCOUNTER — Other Ambulatory Visit: Payer: Self-pay | Admitting: Cardiovascular Disease

## 2023-12-25 DIAGNOSIS — I509 Heart failure, unspecified: Secondary | ICD-10-CM

## 2024-01-03 ENCOUNTER — Telehealth: Payer: Self-pay | Admitting: *Deleted

## 2024-01-03 ENCOUNTER — Ambulatory Visit: Payer: Self-pay

## 2024-01-03 NOTE — Telephone Encounter (Signed)
 FYI Only or Action Required?: FYI only for provider.  Patient was last seen in primary care on 03/15/2023 by Tanda Bleacher, MD. Called Nurse Triage reporting Leg Pain. Symptoms began today. Interventions attempted: Nothing. Symptoms are: unchanged.  Triage Disposition: See HCP Within 4 Hours (Or PCP Triage)  Patient/caregiver understands and will follow disposition?: Yes  Copied from CRM [id number]. Topic: Clinical - Red Word Triage >> Jan 03, 2024  8:45 AM Charlet HERO wrote: Red Word that prompted transfer to Nurse Triage: Patient is calling about son b/p being 134/100 heart rate is 77 and he is stating that his left leg is in pain near the calf. Reason for Disposition  [1] SEVERE pain (e.g., excruciating, unable to do any normal activities) AND [2] not improved after 2 hours of pain medicine  Answer Assessment - Initial Assessment Questions 1. ONSET: When did the pain start?      Started this morning 2. LOCATION: Where is the pain located?      Left lower leg  3. PAIN: How bad is the pain?    (Scale 1-10; or mild, moderate, severe)   -  MILD (1-3): doesn't interfere with normal activities    -  MODERATE (4-7): interferes with normal activities (e.g., work or school) or awakens from sleep, limping    -  SEVERE (8-10): excruciating pain, unable to do any normal activities, unable to walk     Moderate 4. WORK OR EXERCISE: Has there been any recent work or exercise that involved this part of the body?      no 5. CAUSE: What do you think is causing the leg pain?     unsure 6. OTHER SYMPTOMS: Do you have any other symptoms? (e.g., chest pain, back pain, breathing difficulty, swelling, rash, fever, numbness, weakness)     Initial BP was 134/100-mother rechecking blood pressure while on the phone with Nurse Triage. BP 142/100 HR 76 on recheck.   Patient's mother calling for patient-patient reports to family that left calf area was in pain. Reports patient was sleeping and woke up due  to pain. Mother states patient is limping some today and seems a little off balance. Mother states that patient is holding his hands differently. Mother is concerned for possible blood clot.  Protocols used: Leg Pain-A-AH

## 2024-01-03 NOTE — Telephone Encounter (Signed)
 Mom call about son leg pain and BP. Mom said son  is not having any SOB,warm legs,or swelling in leg but she is keeping watch. Mom said that when she recheck bp it had went down. Mom will take son to ER if she see the need. Patient has appt on 01/10/2024 with pcp

## 2024-01-03 NOTE — Telephone Encounter (Signed)
 I have attempted without success to contact this patient by phone to return their call and I left a message on answering machine.

## 2024-01-10 ENCOUNTER — Ambulatory Visit (INDEPENDENT_AMBULATORY_CARE_PROVIDER_SITE_OTHER): Payer: MEDICAID | Admitting: Family Medicine

## 2024-01-10 ENCOUNTER — Encounter: Payer: Self-pay | Admitting: Family Medicine

## 2024-01-10 VITALS — BP 150/105 | HR 89 | Wt 200.0 lb

## 2024-01-10 DIAGNOSIS — Z1329 Encounter for screening for other suspected endocrine disorder: Secondary | ICD-10-CM | POA: Diagnosis not present

## 2024-01-10 DIAGNOSIS — Z13220 Encounter for screening for lipoid disorders: Secondary | ICD-10-CM

## 2024-01-10 DIAGNOSIS — Z130 Encounter for screening for diseases of the blood and blood-forming organs and certain disorders involving the immune mechanism: Secondary | ICD-10-CM | POA: Diagnosis not present

## 2024-01-10 DIAGNOSIS — Z1211 Encounter for screening for malignant neoplasm of colon: Secondary | ICD-10-CM

## 2024-01-10 DIAGNOSIS — Z0000 Encounter for general adult medical examination without abnormal findings: Secondary | ICD-10-CM | POA: Diagnosis not present

## 2024-01-10 DIAGNOSIS — R4189 Other symptoms and signs involving cognitive functions and awareness: Secondary | ICD-10-CM

## 2024-01-10 DIAGNOSIS — Z13228 Encounter for screening for other metabolic disorders: Secondary | ICD-10-CM

## 2024-01-10 NOTE — Progress Notes (Signed)
 Established Patient Office Visit  Subjective    Patient ID: Alan Owens, male    DOB: 1981/04/09  Age: 43 y.o. MRN: [id number]  CC:  Chief Complaint  Patient presents with   Annual Exam    Pain in hands and feet-stiffnessand swelling in hand  in hand when waking up     HPI Alan Owens presents for routine annual exam. Patient is accompanied by family member.   Outpatient Encounter Medications as of 01/10/2024  Medication Sig   allopurinol  (ZYLOPRIM ) 100 MG tablet Take 2 tablets (200 mg total) by mouth daily.   ALPHAGAN  P 0.1 % SOLN Apply 1 drop to eye in the morning and at bedtime.   ALPHAGAN  P 0.15 % ophthalmic solution 1 drop 2 (two) times daily.   amLODipine  (NORVASC ) 5 MG tablet TAKE 1 TABLET (5 MG TOTAL) BY MOUTH DAILY.   ascorbic acid  (VITAMIN C) 500 MG tablet Take 1 tablet (500 mg total) by mouth daily.   Aspirin  81 MG CAPS 1 tab(s) orally once a day   ASPIRIN  LOW DOSE 81 MG EC tablet TAKE 1 TABLET BY MOUTH EVERY DAY   atorvastatin  (LIPITOR) 20 MG tablet TAKE 1 TABLET BY MOUTH DAILY AT 6 PM.   carvedilol  (COREG ) 12.5 MG tablet Take 1 tablet (12.5 mg total) by mouth 2 (two) times daily with a meal.   carvedilol  (COREG ) 6.25 MG tablet TAKE 1 TABLET BY MOUTH TWICE A DAY WITH FOOD   furosemide  (LASIX ) 20 MG tablet TAKE 1 TABLET BY MOUTH EVERY DAY   hydrOXYzine  (ATARAX ) 50 MG tablet TAKE 1 TABLET BY MOUTH EVERY DAY AT NIGHT   Ipratropium-Albuterol  (COMBIVENT ) 20-100 MCG/ACT AERS respimat Inhale 2 puffs into the lungs 2 (two) times daily. Use 2 times daily x 1 week, then as needed.   latanoprost  (XALATAN ) 0.005 % ophthalmic solution Place 1 drop into both eyes at bedtime.   omeprazole  (PRILOSEC) 20 MG capsule 1 cap(s) orally once a day for 30 day(s)   Potassium Chloride  ER 20 MEQ TBCR TAKE 1 TABLET BY MOUTH EVERY DAY   No facility-administered encounter medications on file as of 01/10/2024.    Past Medical History:  Diagnosis Date   Acid reflux    Blind left eye     Congestive heart failure (HCC)    Hypercholesterolemia    Hypertension    Juvenile glaucoma    Mental developmental delay    Renal failure    Vitamin D deficiency     Past Surgical History:  Procedure Laterality Date   MULTIPLE TOOTH EXTRACTIONS Bilateral 2009   all teeth extracted per patient's mother    Family History  Problem Relation Age of Onset   Emphysema Mother    Anemia Sister    Hypertension Brother    Hypertension Brother    Diabetes Brother    Multiple sclerosis Maternal Grandmother    Hypertension Maternal Grandmother    Emphysema Maternal Grandfather    Lupus Maternal Uncle    Gout Maternal Uncle    Colon cancer Neg Hx    Esophageal cancer Neg Hx    Stomach cancer Neg Hx    Rectal cancer Neg Hx     Social History   Socioeconomic History   Marital status: Single    Spouse name: Not on file   Number of children: Not on file   Years of education: Not on file   Highest education level: Not on file  Occupational History   Not on file  Tobacco Use   Smoking status: Never   Smokeless tobacco: Never  Vaping Use   Vaping status: Never Used  Substance and Sexual Activity   Alcohol use: Never   Drug use: Never   Sexual activity: Never  Other Topics Concern   Not on file  Social History Narrative   Not on file   Social Drivers of Health   Financial Resource Strain: Not on file  Food Insecurity: Not on file  Transportation Needs: Not on file  Physical Activity: Not on file  Stress: Not on file  Social Connections: Not on file  Intimate Partner Violence: Not on file    Review of Systems  All other systems reviewed and are negative.       Objective    BP (!) 150/105 (BP Location: Right Arm, Patient Position: Sitting, Cuff Size: Normal)   Pulse 89   Wt 200 lb (90.7 kg)   SpO2 96%   BMI 30.41 kg/m   Physical Exam Vitals and nursing note reviewed.  Constitutional:      General: He is not in acute distress. HENT:     Head:  Normocephalic and atraumatic.     Right Ear: Tympanic membrane, ear canal and external ear normal.     Left Ear: Tympanic membrane, ear canal and external ear normal.     Nose: Nose normal.     Mouth/Throat:     Mouth: Mucous membranes are moist.     Pharynx: Oropharynx is clear.   Eyes:     Conjunctiva/sclera: Conjunctivae normal.     Pupils: Pupils are equal, round, and reactive to light.   Neck:     Thyroid : No thyromegaly.   Cardiovascular:     Rate and Rhythm: Normal rate and regular rhythm.     Heart sounds: Normal heart sounds. No murmur heard. Pulmonary:     Effort: Pulmonary effort is normal.     Breath sounds: Normal breath sounds.  Abdominal:     General: There is no distension.     Palpations: Abdomen is soft. There is no mass.     Tenderness: There is no abdominal tenderness.     Hernia: There is no hernia in the left inguinal area or right inguinal area.   Musculoskeletal:        General: Normal range of motion.     Cervical back: Normal range of motion and neck supple.     Right lower leg: No edema.     Left lower leg: No edema.   Skin:    General: Skin is warm and dry.   Neurological:     General: No focal deficit present.     Mental Status: He is alert and oriented to person, place, and time. Mental status is at baseline.   Psychiatric:        Mood and Affect: Mood normal.        Behavior: Behavior normal.        Cognition and Memory: Cognition is impaired. Memory is impaired.         Assessment & Plan:   1. Annual physical exam (Primary)  - Comprehensive metabolic panel  2. Screening for deficiency anemia - CBC with Differential  3. Screening for lipid disorders  - Lipid Panel  4. Screening for endocrine/metabolic/immunity disorders  - Vitamin D, 25-hydroxy - Hemoglobin A1c - TSH  5. Screening for colon cancer  - Cologuard  6. Cognitive impairment     No follow-ups on file.  Tanda Raguel SQUIBB, MD

## 2024-01-11 LAB — LIPID PANEL
Chol/HDL Ratio: 5.6 ratio — ABNORMAL HIGH (ref 0.0–5.0)
Cholesterol, Total: 218 mg/dL — ABNORMAL HIGH (ref 100–199)
HDL: 39 mg/dL — ABNORMAL LOW (ref >39)
LDL Chol Calc (NIH): 121 mg/dL — ABNORMAL HIGH (ref 0–99)
Triglycerides: 329 mg/dL — ABNORMAL HIGH (ref 0–149)
VLDL Cholesterol Cal: 58 mg/dL — ABNORMAL HIGH (ref 5–40)

## 2024-01-11 LAB — CBC WITH DIFFERENTIAL/PLATELET
Basophils Absolute: 0 10*3/uL (ref 0.0–0.2)
Basos: 1 %
EOS (ABSOLUTE): 0.1 10*3/uL (ref 0.0–0.4)
Eos: 2 %
Hematocrit: 41.8 % (ref 37.5–51.0)
Hemoglobin: 13.8 g/dL (ref 13.0–17.7)
Immature Grans (Abs): 0 10*3/uL (ref 0.0–0.1)
Immature Granulocytes: 0 %
Lymphocytes Absolute: 2 10*3/uL (ref 0.7–3.1)
Lymphs: 41 %
MCH: 31.2 pg (ref 26.6–33.0)
MCHC: 33 g/dL (ref 31.5–35.7)
MCV: 94 fL (ref 79–97)
Monocytes Absolute: 0.4 10*3/uL (ref 0.1–0.9)
Monocytes: 8 %
Neutrophils Absolute: 2.4 10*3/uL (ref 1.4–7.0)
Neutrophils: 48 %
Platelets: 253 10*3/uL (ref 150–450)
RBC: 4.43 x10E6/uL (ref 4.14–5.80)
RDW: 13.6 % (ref 11.6–15.4)
WBC: 4.9 10*3/uL (ref 3.4–10.8)

## 2024-01-11 LAB — COMPREHENSIVE METABOLIC PANEL WITH GFR
ALT: 20 IU/L (ref 0–44)
AST: 17 IU/L (ref 0–40)
Albumin: 4.7 g/dL (ref 4.1–5.1)
Alkaline Phosphatase: 178 IU/L — ABNORMAL HIGH (ref 44–121)
BUN/Creatinine Ratio: 7 — ABNORMAL LOW (ref 9–20)
BUN: 6 mg/dL (ref 6–24)
Bilirubin Total: 0.5 mg/dL (ref 0.0–1.2)
CO2: 19 mmol/L — ABNORMAL LOW (ref 20–29)
Calcium: 9.3 mg/dL (ref 8.7–10.2)
Chloride: 103 mmol/L (ref 96–106)
Creatinine, Ser: 0.91 mg/dL (ref 0.76–1.27)
Globulin, Total: 3.1 g/dL (ref 1.5–4.5)
Glucose: 96 mg/dL (ref 70–99)
Potassium: 3.6 mmol/L (ref 3.5–5.2)
Sodium: 140 mmol/L (ref 134–144)
Total Protein: 7.8 g/dL (ref 6.0–8.5)
eGFR: 106 mL/min/{1.73_m2} (ref >59)

## 2024-01-11 LAB — HEMOGLOBIN A1C
Est. average glucose Bld gHb Est-mCnc: 114 mg/dL
Hgb A1c MFr Bld: 5.6 % (ref 4.8–5.6)

## 2024-01-11 LAB — VITAMIN D 25 HYDROXY (VIT D DEFICIENCY, FRACTURES): Vit D, 25-Hydroxy: 18.5 ng/mL — ABNORMAL LOW (ref 30.0–100.0)

## 2024-01-11 LAB — TSH: TSH: 1.09 u[IU]/mL (ref 0.450–4.500)

## 2024-01-11 NOTE — Telephone Encounter (Signed)
 Mom call about son leg pain and BP. Mom said son  is not having any SOB,warm legs,or swelling in leg but she is keeping watch. Mom said that when she recheck bp it had went down. Mom will take son to ER if she see the need. Patient has appt on 01/10/2024 with pcp

## 2024-01-13 ENCOUNTER — Other Ambulatory Visit: Payer: Self-pay | Admitting: Family Medicine

## 2024-01-16 NOTE — Telephone Encounter (Signed)
 Refill K+

## 2024-01-25 ENCOUNTER — Ambulatory Visit: Payer: Self-pay | Admitting: Family Medicine

## 2024-01-25 MED ORDER — VITAMIN D (ERGOCALCIFEROL) 1.25 MG (50000 UNIT) PO CAPS: 50000.0000 [IU] | ORAL_CAPSULE | ORAL | 0 refills | Status: AC

## 2024-02-06 LAB — COLOGUARD

## 2024-02-19 ENCOUNTER — Other Ambulatory Visit: Payer: Self-pay | Admitting: Cardiovascular Disease

## 2024-03-01 LAB — COLOGUARD: COLOGUARD: NEGATIVE

## 2024-03-22 ENCOUNTER — Other Ambulatory Visit: Payer: Self-pay | Admitting: Cardiovascular Disease

## 2024-03-29 NOTE — Progress Notes (Signed)
 Date:  04/11/2024   ID:  Fraser, Busche 1981/01/06, MRN [id number]   PCP:  Tanda Bleacher, MD  Cardiologist: Delford Electrophysiologist:  None   History of Present Illness:    Alan Owens is a 43 y.o. male has a history of congenital heart disease. 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 Needs aid for feeding. Mother cares for him but known little history. Moved here from [place]. On Medicaid. On both HTN and HLD medicines.   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 Echo was updated 08/09/18  and EF was 45 to 50%. He was continued on medical management.    Wearing depends Not communicative Mom indicates more swelling and BP up at night   TTE 08/18/21 EF stable 45-50%  CTA 07/20/22 aorta 5.3 cm but motion artifact   Dr Sherrine felt observation still warranted   BP Rx with norvasc  5 mg, coreg  12.5 mg bid, lasix  20 mg   Labs 01/10/24 K 3.6 Cr 0.91 Hct 41.8 LDL 121 not clear of compliance with lipitor 20 mg daily   Past Medical History:  Diagnosis Date   Acid reflux    Blind left eye    Congestive heart failure (HCC)    Hypercholesterolemia    Hypertension    Juvenile glaucoma    Mental developmental delay    Renal failure    Vitamin D  deficiency    Past Surgical History:  Procedure Laterality Date   MULTIPLE TOOTH EXTRACTIONS Bilateral 2009   all teeth extracted per patient's mother     Current Meds  Medication Sig   allopurinol  (ZYLOPRIM ) 100 MG tablet Take 2 tablets (200 mg total) by mouth daily.   ALPHAGAN  P 0.15 % ophthalmic solution 1 drop 2 (two) times daily.   amLODipine  (NORVASC ) 5 MG tablet TAKE 1 TABLET (5 MG TOTAL) BY MOUTH DAILY.   ascorbic acid  (VITAMIN C) 500 MG tablet Take 1 tablet (500 mg total) by mouth daily.   Aspirin  81 MG CAPS 1 tab(s) orally once a day   atorvastatin  (LIPITOR) 20 MG tablet TAKE 1 TABLET BY MOUTH DAILY AT 6 PM.   carvedilol  (COREG ) 6.25 MG  tablet TAKE 1 TABLET BY MOUTH TWICE A DAY WITH FOOD   furosemide  (LASIX ) 20 MG tablet TAKE 1 TABLET BY MOUTH EVERY DAY   Ipratropium-Albuterol  (COMBIVENT ) 20-100 MCG/ACT AERS respimat Inhale 2 puffs into the lungs 2 (two) times daily. Use 2 times daily x 1 week, then as needed.   latanoprost  (XALATAN ) 0.005 % ophthalmic solution Place 1 drop into both eyes at bedtime.   omeprazole  (PRILOSEC) 20 MG capsule 1 cap(s) orally once a day for 30 day(s)   Potassium Chloride  ER 20 MEQ TBCR TAKE 1 TABLET BY MOUTH EVERY DAY   Vitamin D , Ergocalciferol , (DRISDOL ) 1.25 MG (50000 UNIT) CAPS capsule Take 1 capsule (50,000 Units total) by mouth every 7 (seven) days.     Allergies:   Patient has no known allergies.   Social History   Tobacco Use   Smoking status: Never   Smokeless tobacco: Never  Vaping Use   Vaping status: Never Used  Substance Use Topics   Alcohol use: Never   Drug use: Never     Family Hx: The patient's family history includes Anemia in his sister; Diabetes in his brother; Emphysema in his maternal grandfather and mother; Gout in his maternal  uncle; Hypertension in his brother, brother, and maternal grandmother; Lupus in his maternal uncle; Multiple sclerosis in his maternal grandmother. There is no history of Colon cancer, Esophageal cancer, Stomach cancer, or Rectal cancer.  ROS:   Please see the history of present illness.   All other systems reviewed are negative.    Objective:    Vital Signs:  BP (!) 140/100   Pulse 92   Ht 5' 7 (1.702 m)   Wt 207 lb (93.9 kg)   SpO2 96%   BMI 32.42 kg/m    Wt Readings from Last 3 Encounters:  04/11/24 207 lb (93.9 kg)  01/10/24 200 lb (90.7 kg)  03/15/23 199 lb 12.8 oz (90.6 kg)    Overweight black male  Non verbal  HEENT: normal Neck supple with no adenopathy JVP normal no bruits no thyromegaly Lungs clear with no wheezing and good diaphragmatic motion Heart:  S1/S2 no murmur, no rub, gallop or click PMI increased   Abdomen: benighn, BS positve, no tenderness, no AAA no bruit.  No HSM or HJR Distal pulses intact with no bruits Trace bilateral  Neuro non-focal Skin warm and dry No muscular weakness   Labs/Other Tests and Data Reviewed:    Lab Results  Component Value Date   WBC 4.9 01/10/2024   HGB 13.8 01/10/2024   HCT 41.8 01/10/2024   PLT 253 01/10/2024   GLUCOSE 96 01/10/2024   CHOL 218 (H) 01/10/2024   TRIG 329 (H) 01/10/2024   HDL 39 (L) 01/10/2024   LDLCALC 121 (H) 01/10/2024   ALT 20 01/10/2024   AST 17 01/10/2024   NA 140 01/10/2024   K 3.6 01/10/2024   CL 103 01/10/2024   CREATININE 0.91 01/10/2024   BUN 6 01/10/2024   CO2 19 (L) 01/10/2024   TSH 1.090 01/10/2024   HGBA1C 5.6 01/10/2024      Prior CV studies:    The following studies were reviewed today:   ECHO IMPRESSIONS 08/18/21   IMPRESSIONS    1. Left ventricular ejection fraction, by estimation, is 45 to 50%. The left ventricle has mildly decreased function. The left ventricle demonstrates global hypokinesis. There is mild concentric left ventricular hypertrophy. Left ventricular diastolic parameters are consistent with Grade I diastolic dysfunction (impaired relaxation).  2. Right ventricular systolic function is normal. The right ventricular size is normal.  3. The mitral valve is normal in structure. Trivial mitral valve regurgitation. No evidence of mitral stenosis.  4. The aortic valve is tricuspid. Aortic valve regurgitation is mild. No aortic stenosis is present.  5. Aneurysm of the aortic root, measuring 46 mm.  6. The inferior vena cava is normal in size with greater than 50% respiratory variability, suggesting right atrial pressure of 3 mmHg.       ECG:  SR rate 91 normal 12/12/19 04/11/2024 SR rate 88 LVH no changes    ASSESSMENT & PLAN:     1. Dilated CM - doing well with current meds and low sodium diet EF 45-50% by echo 08/18/21 stable Continue coreg /lasix  Not on ACE/ARB/ARNI with  history of renal failure Cr as high as 3.7  most recent now normal.   3 HTN - Increase norvasc  10 mg take at night. Continue coreg  and lasix  Avoid ACE with history of renal failure now better. If mild swelling persists can consider changing norvasc  to hydralazine  50 mg bid at lunch and night    4. HLD - on statin - ? Compliance discussed with mother   5.  Gout:  on allopurinol  left foot improved f/u Dr Jeannetta Couldn't afford colchicine  on steroids wean per Rheumatology   6.  Aortic Aneurysm:  4.6-5.3 cm with motion artifact on last CTA 02/17/23 this is actually at left of sinuses with ascending aorta only 3.5 cm will screen update TTE. F/U Bartle he can determine need for additional CTA;s  TTE Increase Norvasc  10 mg  F/U BP clinic 3 months  Disposition:  FU with us  in a year    Signed,  Maude Emmer, MD  04/11/2024 2:10 PM    [facility]

## 2024-04-11 ENCOUNTER — Encounter: Payer: Self-pay | Admitting: Cardiovascular Disease

## 2024-04-11 ENCOUNTER — Ambulatory Visit: Payer: MEDICAID | Attending: Internal Medicine | Admitting: Cardiovascular Disease

## 2024-04-11 VITALS — BP 140/100 | HR 92 | Ht 67.0 in | Wt 207.0 lb

## 2024-04-11 DIAGNOSIS — I509 Heart failure, unspecified: Secondary | ICD-10-CM | POA: Diagnosis present

## 2024-04-11 DIAGNOSIS — I420 Dilated cardiomyopathy: Secondary | ICD-10-CM | POA: Insufficient documentation

## 2024-04-11 DIAGNOSIS — I10 Essential (primary) hypertension: Secondary | ICD-10-CM | POA: Insufficient documentation

## 2024-04-11 MED ORDER — AMLODIPINE BESYLATE 10 MG PO TABS: 10.0000 mg | ORAL_TABLET | Freq: Every day | ORAL | 3 refills | Status: AC

## 2024-04-11 NOTE — Patient Instructions (Signed)
 Medication Instructions:  Your physician has recommended you make the following change in your medication:   1) INCREASE amlodipine  (Norvasc ) to 10 mg daily *If you need a refill on your cardiac medications before your next appointment, please call your pharmacy*  Testing/Procedures: ECHO Your physician has requested that you have an echocardiogram. Echocardiography is a painless test that uses sound waves to create images of your heart. It provides your doctor with information about the size and shape of your heart and how well your heart's chambers and valves are working. This procedure takes approximately one hour. There are no restrictions for this procedure. Please do NOT wear cologne, perfume, aftershave, or lotions (deodorant is allowed). Please arrive 15 minutes prior to your appointment time.  Please note: We ask at that you not bring children with you during ultrasound (echo/ vascular) testing. Due to room size and safety concerns, children are not allowed in the ultrasound rooms during exams. Our front office staff cannot provide observation of children in our lobby area while testing is being conducted. An adult accompanying a patient to their appointment will only be allowed in the ultrasound room at the discretion of the ultrasound technician under special circumstances. We apologize for any inconvenience.  Follow-Up: At [facility], you and your health needs are our priority.  As part of our continuing mission to provide you with exceptional heart care, our providers are all part of one team.  This team includes your primary Cardiologist (physician) and Advanced Practice Providers or APPs (Physician Assistants and Nurse Practitioners) who all work together to provide you with the care you need, when you need it.  Your next appointment:   2-3 months with Pharm D in [facility] Clinic 1 year with Dr. Delford  Provider:   Maude Delford, MD   We recommend signing up for the patient  portal called MyChart.  Sign up information is provided on this After Visit Summary.  MyChart is used to connect with patients for Virtual Visits (Telemedicine).  Patients are able to view lab/test results, encounter notes, upcoming appointments, etc.  Non-urgent messages can be sent to your provider as well.    To learn more about what you can do with MyChart, go to [email or web address].

## 2024-04-18 ENCOUNTER — Other Ambulatory Visit: Payer: Self-pay | Admitting: Family Medicine

## 2024-04-24 ENCOUNTER — Other Ambulatory Visit: Payer: Self-pay | Admitting: Family Medicine

## 2024-05-22 ENCOUNTER — Other Ambulatory Visit: Payer: Self-pay | Admitting: Family Medicine

## 2024-05-28 ENCOUNTER — Ambulatory Visit (HOSPITAL_COMMUNITY)

## 2024-06-04 ENCOUNTER — Telehealth: Payer: Self-pay | Admitting: Family Medicine

## 2024-06-04 NOTE — Telephone Encounter (Signed)
 A document form has been faxed: prior approval needed for incontinence supplies, to be filled out by provider. Send document back via Fax within 7-days to 14 days. Document is located in providers tray at front office.          Fax number: [phone]

## 2024-06-05 NOTE — Telephone Encounter (Signed)
 Received. Given to Dr. Tanda for review and signature

## 2024-06-15 ENCOUNTER — Telehealth: Payer: Self-pay | Admitting: Family Medicine

## 2024-06-15 NOTE — Telephone Encounter (Signed)
Complete and faxed back.

## 2024-06-15 NOTE — Telephone Encounter (Signed)
 A document form has been faxed: additional info needed for CMN, to be filled out by provider. Send document back via Fax within ASAP. Document is located in providers tray at front office.          Fax number: [phone]

## 2024-06-27 ENCOUNTER — Ambulatory Visit: Admitting: Pharmacist Clinician (PhC)/ Clinical Pharmacy Specialist

## 2024-07-03 ENCOUNTER — Ambulatory Visit (HOSPITAL_COMMUNITY)

## 2024-07-08 ENCOUNTER — Other Ambulatory Visit: Payer: Self-pay | Admitting: Family Medicine

## 2024-07-08 DIAGNOSIS — I509 Heart failure, unspecified: Secondary | ICD-10-CM

## 2024-07-09 NOTE — Telephone Encounter (Signed)
 Complete

## 2024-07-18 ENCOUNTER — Ambulatory Visit (HOSPITAL_COMMUNITY)

## 2024-07-18 ENCOUNTER — Ambulatory Visit: Admitting: Pharmacist Clinician (PhC)/ Clinical Pharmacy Specialist

## 2024-07-24 ENCOUNTER — Other Ambulatory Visit: Payer: Self-pay | Admitting: Family Medicine

## 2024-07-26 ENCOUNTER — Telehealth: Payer: Self-pay | Admitting: Family Medicine

## 2024-07-26 NOTE — Telephone Encounter (Signed)
 A document form from [employer] has been faxed: medicaid request for prior approval: adult size pull-on, to be filled out by provider. Send document back via Fax within 7-days to 14 days. Document is located in providers tray at front office.          Fax number: [phone]

## 2024-07-27 NOTE — Telephone Encounter (Signed)
Has been signed and faxed back!

## 2024-07-31 ENCOUNTER — Ambulatory Visit (HOSPITAL_COMMUNITY)
Admission: RE | Admit: 2024-07-31 | Discharge: 2024-07-31 | Disposition: A | Source: Ambulatory Visit | Attending: Cardiovascular Disease | Admitting: Cardiovascular Disease

## 2024-07-31 ENCOUNTER — Ambulatory Visit: Payer: Self-pay | Admitting: Cardiovascular Disease

## 2024-07-31 DIAGNOSIS — I10 Essential (primary) hypertension: Secondary | ICD-10-CM | POA: Insufficient documentation

## 2024-07-31 DIAGNOSIS — I77810 Thoracic aortic ectasia: Secondary | ICD-10-CM | POA: Diagnosis not present

## 2024-07-31 DIAGNOSIS — I420 Dilated cardiomyopathy: Secondary | ICD-10-CM | POA: Diagnosis not present

## 2024-07-31 LAB — ECHOCARDIOGRAM COMPLETE
Area-P 1/2: 5.88 cm2
Calc EF: 62.3 %
S' Lateral: 3.1 cm
Single Plane A2C EF: 57.4 %
Single Plane A4C EF: 67.5 %

## 2024-08-01 ENCOUNTER — Telehealth: Payer: Self-pay | Admitting: Family Medicine

## 2024-08-01 NOTE — Telephone Encounter (Signed)
 Completed and faxed back

## 2024-08-01 NOTE — Telephone Encounter (Signed)
 Copied from CRM #[id number]. Topic: General - Other >> Aug 01, 2024 10:15 AM Macario HERO wrote: Reason for CRM: Ubaldo from [facility] said documents were sent to them not completed. They did fax it back to us  on 07/25/24 and they are calling to check the status of it so they can get patient incontinence supply delivered. Requesting we fax it back to them.  Fax: [phone]  Call back: [phone]

## 2024-08-14 ENCOUNTER — Telehealth: Payer: Self-pay | Admitting: Family Medicine

## 2024-08-14 NOTE — Telephone Encounter (Signed)
 Copied from CRM [id number]. Topic: General - Other >> Aug 14, 2024  8:33 AM Rosaria BRAVO wrote: Reason for CRM: Luke from [facility]  Best contact: [phone]  Document was illegible initially, faxed back 01/21. Still waiting correspondence.

## 2024-08-17 NOTE — Telephone Encounter (Signed)
 Dr. Tanda has signed new document and I have faxed it back

## 2024-09-03 ENCOUNTER — Ambulatory Visit: Admitting: Pharmacist Clinician (PhC)/ Clinical Pharmacy Specialist
